# Patient Record
Sex: Male | Born: 1995 | Race: White | Hispanic: No | Marital: Married | Smoking: Never smoker
Health system: Southern US, Community
[De-identification: ages and names within clinical notes are randomized; demographics above are authoritative.]

## PROBLEM LIST (undated history)

## (undated) DIAGNOSIS — J302 Other seasonal allergic rhinitis: Secondary | ICD-10-CM

## (undated) DIAGNOSIS — F909 Attention-deficit hyperactivity disorder, unspecified type: Secondary | ICD-10-CM

## (undated) DIAGNOSIS — R040 Epistaxis: Secondary | ICD-10-CM

## (undated) DIAGNOSIS — F329 Major depressive disorder, single episode, unspecified: Secondary | ICD-10-CM

## (undated) DIAGNOSIS — F32A Depression, unspecified: Secondary | ICD-10-CM

## (undated) DIAGNOSIS — J45909 Unspecified asthma, uncomplicated: Secondary | ICD-10-CM

## (undated) HISTORY — PX: SHOULDER ARTHROSCOPY: SHX128

---
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
## 2016-08-11 ENCOUNTER — Other Ambulatory Visit: Payer: Self-pay | Admitting: Occupational Medicine

## 2016-08-11 ENCOUNTER — Ambulatory Visit: Payer: Self-pay

## 2016-08-11 DIAGNOSIS — M79671 Pain in right foot: Secondary | ICD-10-CM

## 2016-08-31 ENCOUNTER — Other Ambulatory Visit: Payer: Self-pay | Admitting: Occupational Medicine

## 2016-08-31 ENCOUNTER — Ambulatory Visit: Payer: Self-pay

## 2016-08-31 DIAGNOSIS — M79671 Pain in right foot: Secondary | ICD-10-CM

## 2017-04-13 ENCOUNTER — Other Ambulatory Visit: Payer: Self-pay | Admitting: *Deleted

## 2017-04-25 ENCOUNTER — Ambulatory Visit: Payer: Self-pay | Admitting: Family Medicine

## 2017-04-26 ENCOUNTER — Encounter: Payer: Self-pay | Admitting: Family Medicine

## 2017-12-23 ENCOUNTER — Other Ambulatory Visit: Payer: Self-pay

## 2017-12-23 ENCOUNTER — Emergency Department (HOSPITAL_COMMUNITY)
Admission: EM | Admit: 2017-12-23 | Discharge: 2017-12-24 | Disposition: A | Discharge status: Home / Self Care | Attending: Emergency Medicine | Admitting: Emergency Medicine

## 2017-12-23 ENCOUNTER — Encounter (HOSPITAL_COMMUNITY): Payer: Self-pay | Admitting: *Deleted

## 2017-12-23 DIAGNOSIS — F329 Major depressive disorder, single episode, unspecified: Secondary | ICD-10-CM | POA: Diagnosis not present

## 2017-12-23 DIAGNOSIS — R1030 Lower abdominal pain, unspecified: Secondary | ICD-10-CM | POA: Diagnosis not present

## 2017-12-23 DIAGNOSIS — F909 Attention-deficit hyperactivity disorder, unspecified type: Secondary | ICD-10-CM | POA: Insufficient documentation

## 2017-12-23 DIAGNOSIS — K6289 Other specified diseases of anus and rectum: Secondary | ICD-10-CM | POA: Diagnosis not present

## 2017-12-23 DIAGNOSIS — K625 Hemorrhage of anus and rectum: Secondary | ICD-10-CM

## 2017-12-23 DIAGNOSIS — J45909 Unspecified asthma, uncomplicated: Secondary | ICD-10-CM | POA: Insufficient documentation

## 2017-12-23 DIAGNOSIS — Z79899 Other long term (current) drug therapy: Secondary | ICD-10-CM | POA: Insufficient documentation

## 2017-12-23 NOTE — ED Triage Notes (Signed)
Pt c/o right side abd pain, rectal bleeding that has been intermittent for the past few months, worse since Monday, pt also c/o urinary retention, pt also reports that a few weeks ago he was vomiting dark emesis.

## 2017-12-24 LAB — COMPREHENSIVE METABOLIC PANEL
ALK PHOS: 44 U/L (ref 38–126)
ALT: 14 U/L (ref 0–44)
AST: 21 U/L (ref 15–41)
Albumin: 5 g/dL (ref 3.5–5.0)
Anion gap: 10 (ref 5–15)
BILIRUBIN TOTAL: 1.8 mg/dL — AB (ref 0.3–1.2)
BUN: 9 mg/dL (ref 6–20)
CALCIUM: 9.7 mg/dL (ref 8.9–10.3)
CO2: 26 mmol/L (ref 22–32)
CREATININE: 0.99 mg/dL (ref 0.61–1.24)
Chloride: 107 mmol/L (ref 98–111)
GFR calc Af Amer: >60 mL/min (ref >60)
GLUCOSE: 94 mg/dL (ref 70–99)
Potassium: 3.4 mmol/L — ABNORMAL LOW (ref 3.5–5.1)
Sodium: 143 mmol/L (ref 135–145)
TOTAL PROTEIN: 8 g/dL (ref 6.5–8.1)

## 2017-12-24 LAB — CBC WITH DIFFERENTIAL/PLATELET
Abs Immature Granulocytes: 0.01 10*3/uL (ref 0.00–0.07)
BASOS ABS: 0 10*3/uL (ref 0.0–0.1)
Basophils Relative: 0 %
Eosinophils Absolute: 0.2 10*3/uL (ref 0.0–0.5)
Eosinophils Relative: 2 %
HEMATOCRIT: 42.1 % (ref 39.0–52.0)
Hemoglobin: 14.3 g/dL (ref 13.0–17.0)
IMMATURE GRANULOCYTES: 0 %
Lymphocytes Relative: 26 %
Lymphs Abs: 2 10*3/uL (ref 0.7–4.0)
MCH: 29 pg (ref 26.0–34.0)
MCHC: 34 g/dL (ref 30.0–36.0)
MCV: 85.4 fL (ref 80.0–100.0)
Monocytes Absolute: 0.6 10*3/uL (ref 0.1–1.0)
Monocytes Relative: 8 %
Neutro Abs: 4.9 10*3/uL (ref 1.7–7.7)
Neutrophils Relative %: 64 %
Platelets: 307 10*3/uL (ref 150–400)
RBC: 4.93 MIL/uL (ref 4.22–5.81)
RDW: 12.3 % (ref 11.5–15.5)
WBC: 7.6 10*3/uL (ref 4.0–10.5)
nRBC: 0 % (ref 0.0–0.2)

## 2017-12-24 LAB — POC OCCULT BLOOD, ED: Fecal Occult Bld: POSITIVE — AB

## 2017-12-24 MED ORDER — HYDROCORTISONE ACETATE 25 MG RE SUPP
25.0000 mg | Freq: Once | RECTAL | Status: AC
  Administered 2017-12-24: 25 mg via RECTAL
  Filled 2017-12-24: qty 1

## 2017-12-24 MED ORDER — HYDROCORTISONE ACETATE 25 MG RE SUPP: 25.0000 mg | Freq: Two times a day (BID) | RECTAL | 0 refills | Status: DC

## 2017-12-24 NOTE — ED Provider Notes (Signed)
[facility] EMERGENCY DEPARTMENT Provider Note   CSN: [id number] Arrival date & time: 12/23/17  2319  Time seen 23:53 PM    History   Chief Complaint Chief Complaint  Patient presents with  . Abdominal Pain    HPI Tom Collins is a 22 y.o. male.  HPI patient is here for his girlfriend and his mother.  He states he has been having rectal bleeding and rectal pain off and on for the past 7 months.  He also describes abdominal pain that comes and goes for the past 2 to 3 months.  He states he gets the abdominal pain several times a week.  It is usually on the right side and he describes it as sharp, crampy, and aching.  It will last a few hours.  He states when he eats breakfast in the morning the abdominal pain will quickly come on.  But it feels better after having a bowel movement.  He states he sees blood every time he has a bowel movement.  He sees blood when he wipes and sees blood in the toilet.  He states he has nausea and vomiting for the past 3 months and it happens a few times a month.  When he does have vomiting he will only have 1 or 2 episodes that day.  Sometimes he gets burning fluid or reflux symptoms.  Family is also concerned for the past year and a half that he gets swelling of his lips and throats when he eats raw vegetables and fruits he can eat them if they are cooked.  He states he is here tonight because he has had increasing bleeding since Monday, November 18.  He has not had any vomiting today.  PCP Richmond Campbell., PA-C   Past Medical History:  Diagnosis Date  . ADHD (attention deficit hyperactivity disorder)   . Asthma   . Depression   . Epistaxis   . Seasonal allergies     Patient Active Problem List   Diagnosis Date Noted  . Multiple allergies 12/09/2015  . Asthma 12/09/2015    Past Surgical History:  Procedure Laterality Date  . SHOULDER ARTHROSCOPY          Home Medications    Prior to Admission medications   Medication Sig Start  Date End Date Taking? Authorizing Provider  amphetamine-dextroamphetamine (ADDERALL) 30 MG tablet TAKE ONE PILL TWICE PER DAY 10/18/17  Yes [provider]  Eszopiclone 3 MG TABS Take immediately before bedtime 10/31/17  Yes [provider]  hydrocortisone (ANUSOL-HC) 25 MG suppository Place 1 suppository (25 mg total) rectally 2 (two) times daily. 12/24/17   Devoria Albe, MD  traMADol (ULTRAM) 50 MG tablet Take by mouth every 6 (six) hours as needed.    [provider]    Family History History reviewed. No pertinent family history.  Social History Social History   Tobacco Use  . Smoking status: Never Smoker  . Smokeless tobacco: Current User  Substance Use Topics  . Alcohol use: No  . Drug use: Never     Allergies   Bee venom and Other   Review of Systems Review of Systems  All other systems reviewed and are negative.    Physical Exam Updated Vital Signs BP 135/84   Pulse (!) 101   Temp 99.2 F (37.3 C) (Oral)   Resp 20   Ht 5\' 10"  (1.778 m)   Wt 86.2 kg   SpO2 100%   BMI 27.26 kg/m   Vital  signs normal borderline tachycardia   Physical Exam  Constitutional: He is oriented to person, place, and time. He appears well-developed and well-nourished.  Non-toxic appearance. He does not appear ill. No distress.  HENT:  Head: Normocephalic and atraumatic.  Right Ear: External ear normal.  Left Ear: External ear normal.  Nose: Nose normal. No mucosal edema or rhinorrhea.  Mouth/Throat: Oropharynx is clear and moist and mucous membranes are normal. No dental abscesses or uvula swelling.  Eyes: Pupils are equal, round, and reactive to light. Conjunctivae and EOM are normal.  Neck: Normal range of motion and full passive range of motion without pain. Neck supple.  Cardiovascular: Normal rate, regular rhythm and normal heart sounds. Exam reveals no gallop and no friction rub.  No murmur heard. Pulmonary/Chest: Effort normal and breath sounds  normal. No respiratory distress. He has no wheezes. He has no rhonchi. He has no rales. He exhibits no tenderness and no crepitus.  Abdominal: Soft. Normal appearance. He exhibits no distension. Bowel sounds are decreased. There is no tenderness. There is no rebound and no guarding.  Genitourinary:  Genitourinary Comments: When I examine his rectal area there is no visible hemorrhoids or abnormal swelling or redness seen.  On rectal exam he is tight, there was no obvious masses felt.  There was no stool or gross blood on my finger however there were 2 small specks of blood in the Hemoccult did test positive on 1 of the specks.  Musculoskeletal: Normal range of motion. He exhibits no edema or tenderness.  Moves all extremities well.   Neurological: He is alert and oriented to person, place, and time. He has normal strength. No cranial nerve deficit.  Skin: Skin is warm, dry and intact. No rash noted. No erythema. No pallor.  Psychiatric: He has a normal mood and affect. His speech is normal and behavior is normal. His mood appears not anxious.  Nursing note and vitals reviewed.    ED Treatments / Results  Labs (all labs ordered are listed, but only abnormal results are displayed) Results for orders placed or performed during the hospital encounter of 12/23/17  Comprehensive metabolic panel  Result Value Ref Range   Sodium 143 135 - 145 mmol/L   Potassium 3.4 (L) 3.5 - 5.1 mmol/L   Chloride 107 98 - 111 mmol/L   CO2 26 22 - 32 mmol/L   Glucose, Bld 94 70 - 99 mg/dL   BUN 9 6 - 20 mg/dL   Creatinine, Ser 9.140.99 0.61 - 1.24 mg/dL   Calcium 9.7 8.9 - 78.210.3 mg/dL   Total Protein 8.0 6.5 - 8.1 g/dL   Albumin 5.0 3.5 - 5.0 g/dL   AST 21 15 - 41 U/L   ALT 14 0 - 44 U/L   Alkaline Phosphatase 44 38 - 126 U/L   Total Bilirubin 1.8 (H) 0.3 - 1.2 mg/dL   GFR calc non Af Amer >60 >60 mL/min   GFR calc Af Amer >60 >60 mL/min   Anion gap 10 5 - 15  CBC with Differential  Result Value Ref Range    WBC 7.6 4.0 - 10.5 K/uL   RBC 4.93 4.22 - 5.81 MIL/uL   Hemoglobin 14.3 13.0 - 17.0 g/dL   HCT 95.642.1 21.339.0 - 08.652.0 %   MCV 85.4 80.0 - 100.0 fL   MCH 29.0 26.0 - 34.0 pg   MCHC 34.0 30.0 - 36.0 g/dL   RDW 57.812.3 46.911.5 - 62.915.5 %   Platelets 307 150 - 400  K/uL   nRBC 0.0 0.0 - 0.2 %   Neutrophils Relative % 64 %   Neutro Abs 4.9 1.7 - 7.7 K/uL   Lymphocytes Relative 26 %   Lymphs Abs 2.0 0.7 - 4.0 K/uL   Monocytes Relative 8 %   Monocytes Absolute 0.6 0.1 - 1.0 K/uL   Eosinophils Relative 2 %   Eosinophils Absolute 0.2 0.0 - 0.5 K/uL   Basophils Relative 0 %   Basophils Absolute 0.0 0.0 - 0.1 K/uL   Immature Granulocytes 0 %   Abs Immature Granulocytes 0.01 0.00 - 0.07 K/uL  POC occult blood, ED Provider will collect  Result Value Ref Range   Fecal Occult Bld POSITIVE (A) NEGATIVE   Laboratory interpretation all normal except mildly positive Hemoccult, mild hypokalemia    EKG None  Radiology No results found.  Procedures Procedures (including critical care time)  Medications Ordered in ED Medications  hydrocortisone (ANUSOL-HC) suppository 25 mg (has no administration in time range)     Initial Impression / Assessment and Plan / ED Course  I have reviewed the triage vital signs and the nursing notes.  Pertinent labs & imaging results that were available during my care of the patient were reviewed by me and considered in my medical decision making (see chart for details).    After doing his rectal exam I talked to the patient he may have internal hemorrhoids.  Mother is concerned that he may have polyps.  I explained to her that that would have to be determined by gastroenterologist by colonoscopy.  She looked at me as if I did know what I was talking about.  Patient's initial bladder scan showed 515 cc of urine.  His post void residual was 17 cc.  Patient does not have evidence of urinary retention.  Patient will be treated for internal hemorrhoids and referred to  gastroenterology for further evaluation of his rectal bleeding and his abdominal pain.  He had no abdominal pain today in the ED.  He is not anemic from the bleeding.  Final Clinical Impressions(s) / ED Diagnoses   Final diagnoses:  Rectal pain  Rectal bleeding  Lower abdominal pain    ED Discharge Orders         Ordered    hydrocortisone (ANUSOL-HC) 25 MG suppository  2 times daily     12/24/17 0249         Plan discharge  Devoria Albe, MD, Concha Pyo, MD 12/24/17 [phone]

## 2017-12-24 NOTE — Discharge Instructions (Addendum)
Use the suppositories which should help calm down pain from your internal hemorrhoids.  Please call Dr. Patty Sermonsehman's office to schedule a appointment to be evaluated for your rectal pain and bleeding and abdominal pain.  He is a gastroenterologist.  You can discuss your allergies to raw fruits and vegetables with your primary care doctor.

## 2018-08-06 ENCOUNTER — Ambulatory Visit
Admission: RE | Admit: 2018-08-06 | Discharge: 2018-08-06 | Disposition: A | Discharge status: Home / Self Care | Source: Ambulatory Visit | Attending: Physician Assistant | Admitting: Physician Assistant

## 2018-08-06 ENCOUNTER — Other Ambulatory Visit: Payer: Self-pay | Admitting: Physician Assistant

## 2018-08-06 ENCOUNTER — Other Ambulatory Visit: Payer: Self-pay

## 2018-08-06 DIAGNOSIS — M545 Low back pain, unspecified: Secondary | ICD-10-CM

## 2019-02-18 ENCOUNTER — Ambulatory Visit: Payer: Self-pay | Attending: Internal Medicine

## 2019-02-18 ENCOUNTER — Other Ambulatory Visit: Payer: Self-pay

## 2019-02-18 DIAGNOSIS — Z20822 Contact with and (suspected) exposure to covid-19: Secondary | ICD-10-CM | POA: Insufficient documentation

## 2019-02-19 LAB — NOVEL CORONAVIRUS, NAA: SARS-CoV-2, NAA: NOT DETECTED

## 2019-09-15 ENCOUNTER — Other Ambulatory Visit: Payer: Self-pay

## 2019-09-15 DIAGNOSIS — R202 Paresthesia of skin: Secondary | ICD-10-CM | POA: Insufficient documentation

## 2019-09-15 DIAGNOSIS — J45909 Unspecified asthma, uncomplicated: Secondary | ICD-10-CM | POA: Diagnosis not present

## 2019-09-15 DIAGNOSIS — G8929 Other chronic pain: Secondary | ICD-10-CM | POA: Insufficient documentation

## 2019-09-15 DIAGNOSIS — M545 Low back pain: Secondary | ICD-10-CM | POA: Insufficient documentation

## 2019-09-15 DIAGNOSIS — M549 Dorsalgia, unspecified: Secondary | ICD-10-CM | POA: Diagnosis present

## 2019-09-16 ENCOUNTER — Emergency Department (HOSPITAL_COMMUNITY)
Admission: EM | Admit: 2019-09-16 | Discharge: 2019-09-16 | Disposition: A | Discharge status: Home / Self Care | Payer: PRIVATE HEALTH INSURANCE | Attending: Emergency Medicine | Admitting: Emergency Medicine

## 2019-09-16 ENCOUNTER — Other Ambulatory Visit: Payer: Self-pay

## 2019-09-16 ENCOUNTER — Emergency Department (HOSPITAL_COMMUNITY): Payer: PRIVATE HEALTH INSURANCE

## 2019-09-16 ENCOUNTER — Encounter (HOSPITAL_COMMUNITY): Payer: Self-pay | Admitting: Emergency Medicine

## 2019-09-16 DIAGNOSIS — G8929 Other chronic pain: Secondary | ICD-10-CM

## 2019-09-16 LAB — URINALYSIS, ROUTINE W REFLEX MICROSCOPIC
Bilirubin Urine: NEGATIVE
Glucose, UA: NEGATIVE mg/dL
Hgb urine dipstick: NEGATIVE
Ketones, ur: NEGATIVE mg/dL
Leukocytes,Ua: NEGATIVE
Nitrite: NEGATIVE
Protein, ur: NEGATIVE mg/dL
Specific Gravity, Urine: 1.008 (ref 1.005–1.030)
pH: 7 (ref 5.0–8.0)

## 2019-09-16 MED ORDER — KETOROLAC TROMETHAMINE 30 MG/ML IJ SOLN
30.0000 mg | Freq: Once | INTRAMUSCULAR | Status: AC
  Administered 2019-09-16: 30 mg via INTRAMUSCULAR
  Filled 2019-09-16: qty 1

## 2019-09-16 MED ORDER — METHOCARBAMOL 500 MG PO TABS: 500.0000 mg | ORAL_TABLET | Freq: Three times a day (TID) | ORAL | 0 refills | Status: DC | PRN

## 2019-09-16 MED ORDER — HYDROCODONE-ACETAMINOPHEN 5-325 MG PO TABS
1.0000 | ORAL_TABLET | Freq: Once | ORAL | Status: AC
  Administered 2019-09-16: 1 via ORAL
  Filled 2019-09-16: qty 1

## 2019-09-16 MED ORDER — METHOCARBAMOL 500 MG PO TABS
500.0000 mg | ORAL_TABLET | Freq: Once | ORAL | Status: AC
  Administered 2019-09-16: 500 mg via ORAL
  Filled 2019-09-16: qty 1

## 2019-09-16 MED ORDER — METHYLPREDNISOLONE 4 MG PO TBPK: ORAL_TABLET | ORAL | 0 refills | Status: DC

## 2019-09-16 NOTE — Discharge Instructions (Addendum)
Take the steroids and muscle relaxers as prescribed.  Follow-up with the neurosurgeon for an MRI.  Return to the ED with worsening pain, fever, weakness, numbness, tingling, bowel or bladder incontinence or any other concerns.

## 2019-09-16 NOTE — ED Triage Notes (Addendum)
Pt c/o lower back pain that has been going on for a while. Pt seen by ortho about a year ago and was told he had fractures at L4 and L5.

## 2019-09-16 NOTE — ED Provider Notes (Signed)
[facility] EMERGENCY DEPARTMENT Provider Note   CSN: [id number] Arrival date & time: 09/15/19  2251     History Chief Complaint  Patient presents with  . Back Pain    Tom Collins is a 24 y.o. male.  Patient with a history of chronic low back pain for the past 2 years.  States he has pain on a daily basis but has not taken thing for it at home.  Has seen orthopedics in the past and told he had "fractures of L4 and L5" but has not followed back up with them.  He was told he might need a spinal fusion.  Today he is coming in with worsening back pain after getting out of a chair.  He states he is not had pain this bad for a long time.  The pain is in the center of his back with some radiation down his left leg.  There is some numbness and tingling in his left foot which is been ongoing for the past couple months.  There is no weakness in the leg.  There is no bowel or bladder incontinence.  There is no fever or vomiting.  No chest pain or shortness of breath.  No abdominal pain, pain with urination or blood in the urine.  No previous back surgeries.  He states the pain is in the same location but worse than usual.  He is not certain what type of fractures that he had.  He denies having a fall recently or in the past. Denies any history of cancer or IV drug abuse.   The history is provided by the patient.  Back Pain Associated symptoms: no abdominal pain, no chest pain, no dysuria, no fever, no headaches and no weakness        Past Medical History:  Diagnosis Date  . ADHD (attention deficit hyperactivity disorder)   . Asthma   . Depression   . Epistaxis   . Seasonal allergies     Patient Active Problem List   Diagnosis Date Noted  . Multiple allergies 12/09/2015  . Asthma 12/09/2015    Past Surgical History:  Procedure Laterality Date  . SHOULDER ARTHROSCOPY         History reviewed. No pertinent family history.  Social History   Tobacco Use  . Smoking status:  Never Smoker  . Smokeless tobacco: Current User  Vaping Use  . Vaping Use: Every day  Substance Use Topics  . Alcohol use: No  . Drug use: Never    Home Medications Prior to Admission medications   Medication Sig Start Date End Date Taking? Authorizing Provider  amphetamine-dextroamphetamine (ADDERALL) 30 MG tablet TAKE ONE PILL TWICE PER DAY 10/18/17   [provider]  Eszopiclone 3 MG TABS Take immediately before bedtime 10/31/17   [provider]  hydrocortisone (ANUSOL-HC) 25 MG suppository Place 1 suppository (25 mg total) rectally 2 (two) times daily. 12/24/17   Devoria Albe, MD  traMADol (ULTRAM) 50 MG tablet Take by mouth every 6 (six) hours as needed.    [provider]    Allergies    Bee venom and Other  Review of Systems   Review of Systems  Constitutional: Negative for activity change, appetite change, fatigue and fever.  HENT: Negative for congestion and rhinorrhea.   Eyes: Negative for visual disturbance.  Respiratory: Negative for cough, chest tightness and shortness of breath.   Cardiovascular: Negative for chest pain.  Gastrointestinal: Negative for abdominal pain, nausea and vomiting.  Genitourinary:  Negative for dysuria and hematuria.  Musculoskeletal: Positive for arthralgias, back pain and myalgias.  Skin: Negative for wound.  Neurological: Negative for dizziness, weakness and headaches.   all other systems are negative except as noted in the HPI and PMH.    Physical Exam Updated Vital Signs BP 122/73 (BP Location: Right Arm)   Pulse 81   Temp 98.5 F (36.9 C) (Oral)   Resp 16   Ht 5\' 9"  (1.753 m)   Wt 83.9 kg   SpO2 100%   BMI 27.32 kg/m   Physical Exam Vitals and nursing note reviewed.  Constitutional:      General: He is not in acute distress.    Appearance: He is well-developed.     Comments: Uncomfortable, pain with position change  HENT:     Head: Normocephalic and atraumatic.     Mouth/Throat:     Pharynx:  No oropharyngeal exudate.  Eyes:     Conjunctiva/sclera: Conjunctivae normal.     Pupils: Pupils are equal, round, and reactive to light.  Neck:     Comments: No meningismus. Cardiovascular:     Rate and Rhythm: Normal rate and regular rhythm.     Heart sounds: Normal heart sounds. No murmur heard.   Pulmonary:     Effort: Pulmonary effort is normal. No respiratory distress.     Breath sounds: Normal breath sounds.  Abdominal:     Palpations: Abdomen is soft.     Tenderness: There is no abdominal tenderness. There is no guarding or rebound.  Musculoskeletal:        General: Tenderness present. Normal range of motion.     Cervical back: Normal range of motion and neck supple.     Comments: Midline lumbar tenderness, no CVA tenderness  , 5/5 strength in bilateral lower extremities. Ankle plantar and dorsiflexion intact. Great toe extension intact bilaterally. +2 DP and PT pulses. +2 patellar reflexes bilaterally. Normal gait. Subjective paresthesias in left foot  Skin:    General: Skin is warm.     Capillary Refill: Capillary refill takes less than 2 seconds.  Neurological:     General: No focal deficit present.     Mental Status: He is alert and oriented to person, place, and time. Mental status is at baseline.     Cranial Nerves: No cranial nerve deficit.     Motor: No abnormal muscle tone.     Coordination: Coordination normal.     Comments: No ataxia on finger to nose bilaterally. No pronator drift. 5/5 strength throughout. CN 2-12 intact.Equal grip strength. Sensation intact.   Psychiatric:        Behavior: Behavior normal.     ED Results / Procedures / Treatments   Labs (all labs ordered are listed, but only abnormal results are displayed) Labs Reviewed  URINALYSIS, ROUTINE W REFLEX MICROSCOPIC - Abnormal; Notable for the following components:      Result Value   Color, Urine STRAW (*)    All other components within normal limits    EKG None  Radiology CT  Lumbar Spine Wo Contrast  Result Date: 09/16/2019 CLINICAL DATA:  Low back pain EXAM: CT LUMBAR SPINE WITHOUT CONTRAST TECHNIQUE: Multidetector CT imaging of the lumbar spine was performed without intravenous contrast administration. Multiplanar CT image reconstructions were also generated. COMPARISON:  Radiograph 08/06/2018 FINDINGS: Segmentation: 5 lumbar type vertebrae. Alignment: Normal. 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 No fracture. Paraspinal and other soft tissues: No paravertebral or paraspinal soft tissue abnormality.  Disc levels: At L1-L2, no significant disc space narrowing. No canal stenosis. Foramen are patent bilaterally. At L2-L3, no significant disc space narrowing. No canal stenosis. The foramen are patent bilaterally. At L3-L4, no significant disc space narrowing. No significant canal stenosis. The foramen are patent bilaterally. At L4-L5, mild broad-based disc bulge. Suspected small central focal disc protrusion with mild mass effect on thecal sac. No canal stenosis. The foramen are patent bilaterally. At L5-S1, no significant canal stenosis. The foramen are patent bilaterally. IMPRESSION: 1. 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 No acute osseous abnormality. No significant listhesis. 2. Suspected central disc protrusion at L4-L5 with mild mass effect on the anterior thecal sac. No canal stenosis. No significant bony foraminal narrowing. Electronically Signed   By: Jasmine Pang M.D.   On: 09/16/2019 03:01    Procedures Procedures (including critical care time)  Medications Ordered in ED Medications  ketorolac (TORADOL) 30 MG/ML injection 30 mg (has no administration in time range)  HYDROcodone-acetaminophen (NORCO/VICODIN) 5-325 MG per tablet 1 tablet (has no administration in time range)  methocarbamol (ROBAXIN) tablet 500 mg (has no administration in time range)    ED Course  I have reviewed the triage vital signs and the nursing  notes.  Pertinent labs & imaging results that were available during my care of the patient were reviewed by me and considered in my medical decision making (see chart for details).    MDM Rules/Calculators/A&P                         Acute on chronic low back pain.  Intact strength, reflexes, sensation and pulses on exam.  Low suspicion for cord compression or cauda equina.  No fever or vomiting.  Patient with intact strength on exam, reflexes, sensation and pulses.  Urinalysis is negative.  He states he had an MRI last year but the results were not available in the system. Not certain what kind of fracture he was told that he had.  CT today shows chronic appearing pars defect at L5.  Small disc protrusion at L4/5 without foraminal narrowing.  Patient able to ambulate.  Will give course of steroids.  Follow-up with neurosurgery. Discussed need for repeat MRI.  Return to the ED with worsening pain, weakness, numbness, tingling, fever, vomiting, bowel or bladder incontinence or any other concerns. Final Clinical Impression(s) / ED Diagnoses Final diagnoses:  Chronic midline low back pain without sciatica    Rx / DC Orders ED Discharge Orders    None       Kristyne Woodring, Jeannett Senior, MD 09/16/19 [phone]

## 2020-05-28 ENCOUNTER — Ambulatory Visit: Payer: Self-pay

## 2020-05-28 ENCOUNTER — Other Ambulatory Visit: Payer: Self-pay

## 2020-05-28 ENCOUNTER — Other Ambulatory Visit: Payer: Self-pay | Admitting: Family Medicine

## 2020-05-28 DIAGNOSIS — M79645 Pain in left finger(s): Secondary | ICD-10-CM

## 2020-06-04 ENCOUNTER — Ambulatory Visit: Payer: Self-pay

## 2020-06-04 ENCOUNTER — Other Ambulatory Visit: Payer: Self-pay

## 2020-06-04 ENCOUNTER — Other Ambulatory Visit: Payer: Self-pay | Admitting: Family Medicine

## 2020-06-04 DIAGNOSIS — M79645 Pain in left finger(s): Secondary | ICD-10-CM

## 2021-07-13 ENCOUNTER — Inpatient Hospital Stay (HOSPITAL_COMMUNITY)
Admission: EM | Admit: 2021-07-13 | Discharge: 2021-07-16 | DRG: 372 | Disposition: A | Discharge status: Home / Self Care | Payer: BLUE CROSS/BLUE SHIELD | Attending: Internal Medicine | Admitting: Internal Medicine

## 2021-07-13 ENCOUNTER — Emergency Department (HOSPITAL_COMMUNITY): Payer: BLUE CROSS/BLUE SHIELD

## 2021-07-13 ENCOUNTER — Encounter (HOSPITAL_COMMUNITY): Payer: Self-pay | Admitting: Emergency Medicine

## 2021-07-13 ENCOUNTER — Other Ambulatory Visit: Payer: Self-pay

## 2021-07-13 DIAGNOSIS — J302 Other seasonal allergic rhinitis: Secondary | ICD-10-CM | POA: Diagnosis present

## 2021-07-13 DIAGNOSIS — A020 Salmonella enteritis: Principal | ICD-10-CM | POA: Diagnosis present

## 2021-07-13 DIAGNOSIS — R7401 Elevation of levels of liver transaminase levels: Secondary | ICD-10-CM | POA: Diagnosis present

## 2021-07-13 DIAGNOSIS — Z20822 Contact with and (suspected) exposure to covid-19: Secondary | ICD-10-CM | POA: Diagnosis present

## 2021-07-13 DIAGNOSIS — Z91030 Bee allergy status: Secondary | ICD-10-CM

## 2021-07-13 DIAGNOSIS — F419 Anxiety disorder, unspecified: Secondary | ICD-10-CM | POA: Diagnosis present

## 2021-07-13 DIAGNOSIS — Z720 Tobacco use: Secondary | ICD-10-CM

## 2021-07-13 DIAGNOSIS — F909 Attention-deficit hyperactivity disorder, unspecified type: Secondary | ICD-10-CM | POA: Diagnosis present

## 2021-07-13 DIAGNOSIS — Z8379 Family history of other diseases of the digestive system: Secondary | ICD-10-CM | POA: Diagnosis not present

## 2021-07-13 DIAGNOSIS — K529 Noninfective gastroenteritis and colitis, unspecified: Secondary | ICD-10-CM | POA: Diagnosis present

## 2021-07-13 DIAGNOSIS — Z79891 Long term (current) use of opiate analgesic: Secondary | ICD-10-CM | POA: Diagnosis not present

## 2021-07-13 DIAGNOSIS — F988 Other specified behavioral and emotional disorders with onset usually occurring in childhood and adolescence: Secondary | ICD-10-CM | POA: Diagnosis present

## 2021-07-13 DIAGNOSIS — Z79899 Other long term (current) drug therapy: Secondary | ICD-10-CM | POA: Diagnosis not present

## 2021-07-13 DIAGNOSIS — R7881 Bacteremia: Secondary | ICD-10-CM | POA: Diagnosis present

## 2021-07-13 DIAGNOSIS — Z888 Allergy status to other drugs, medicaments and biological substances status: Secondary | ICD-10-CM

## 2021-07-13 DIAGNOSIS — R109 Unspecified abdominal pain: Principal | ICD-10-CM

## 2021-07-13 LAB — COMPREHENSIVE METABOLIC PANEL
ALT: 69 U/L — ABNORMAL HIGH (ref 0–44)
AST: 67 U/L — ABNORMAL HIGH (ref 15–41)
Albumin: 3.8 g/dL (ref 3.5–5.0)
Alkaline Phosphatase: 76 U/L (ref 38–126)
Anion gap: 7 (ref 5–15)
BUN: 10 mg/dL (ref 6–20)
CO2: 23 mmol/L (ref 22–32)
Calcium: 8.9 mg/dL (ref 8.9–10.3)
Chloride: 103 mmol/L (ref 98–111)
Creatinine, Ser: 1 mg/dL (ref 0.61–1.24)
GFR, Estimated: >60 mL/min (ref >60)
Glucose, Bld: 140 mg/dL — ABNORMAL HIGH (ref 70–99)
Potassium: 3.3 mmol/L — ABNORMAL LOW (ref 3.5–5.1)
Sodium: 133 mmol/L — ABNORMAL LOW (ref 135–145)
Total Bilirubin: 1.1 mg/dL (ref 0.3–1.2)
Total Protein: 7 g/dL (ref 6.5–8.1)

## 2021-07-13 LAB — URINALYSIS, ROUTINE W REFLEX MICROSCOPIC
Bacteria, UA: NONE SEEN
Bilirubin Urine: NEGATIVE
Glucose, UA: NEGATIVE mg/dL
Hgb urine dipstick: NEGATIVE
Ketones, ur: NEGATIVE mg/dL
Leukocytes,Ua: NEGATIVE
Nitrite: NEGATIVE
Protein, ur: 100 mg/dL — AB
Specific Gravity, Urine: 1.02 (ref 1.005–1.030)
pH: 7 (ref 5.0–8.0)

## 2021-07-13 LAB — CBC
HCT: 39.2 % (ref 39.0–52.0)
Hemoglobin: 13.6 g/dL (ref 13.0–17.0)
MCH: 29.8 pg (ref 26.0–34.0)
MCHC: 34.7 g/dL (ref 30.0–36.0)
MCV: 86 fL (ref 80.0–100.0)
Platelets: 198 10*3/uL (ref 150–400)
RBC: 4.56 MIL/uL (ref 4.22–5.81)
RDW: 12.9 % (ref 11.5–15.5)
WBC: 5.1 10*3/uL (ref 4.0–10.5)
nRBC: 0 % (ref 0.0–0.2)

## 2021-07-13 LAB — SEDIMENTATION RATE: Sed Rate: 29 mm/hr — ABNORMAL HIGH (ref 0–16)

## 2021-07-13 LAB — APTT: aPTT: 33 seconds (ref 24–36)

## 2021-07-13 LAB — LIPASE, BLOOD: Lipase: 29 U/L (ref 11–51)

## 2021-07-13 LAB — LACTIC ACID, PLASMA
Lactic Acid, Venous: 1.4 mmol/L (ref 0.5–1.9)
Lactic Acid, Venous: 0.8 mmol/L (ref 0.5–1.9)

## 2021-07-13 LAB — PROTIME-INR
INR: 1.2 (ref 0.8–1.2)
Prothrombin Time: 15.1 seconds (ref 11.4–15.2)

## 2021-07-13 LAB — SARS CORONAVIRUS 2 BY RT PCR: SARS Coronavirus 2 by RT PCR: NEGATIVE

## 2021-07-13 MED ORDER — IBUPROFEN 400 MG PO TABS
600.0000 mg | ORAL_TABLET | Freq: Four times a day (QID) | ORAL | Status: DC | PRN
  Administered 2021-07-13 – 2021-07-15 (×2): 600 mg via ORAL
  Filled 2021-07-13 (×2): qty 2

## 2021-07-13 MED ORDER — PIPERACILLIN-TAZOBACTAM 3.375 G IVPB 30 MIN
3.3750 g | Freq: Once | INTRAVENOUS | Status: AC
  Administered 2021-07-13: 3.375 g via INTRAVENOUS
  Filled 2021-07-13: qty 50

## 2021-07-13 MED ORDER — ACETAMINOPHEN 325 MG PO TABS
650.0000 mg | ORAL_TABLET | Freq: Four times a day (QID) | ORAL | Status: DC | PRN
  Administered 2021-07-14 (×3): 650 mg via ORAL
  Filled 2021-07-13 (×3): qty 2

## 2021-07-13 MED ORDER — IOHEXOL 300 MG/ML  SOLN
100.0000 mL | Freq: Once | INTRAMUSCULAR | Status: AC | PRN
  Administered 2021-07-13: 100 mL via INTRAVENOUS

## 2021-07-13 MED ORDER — PIPERACILLIN-TAZOBACTAM 3.375 G IVPB
3.3750 g | Freq: Three times a day (TID) | INTRAVENOUS | Status: DC
  Administered 2021-07-13 – 2021-07-14 (×3): 3.375 g via INTRAVENOUS
  Filled 2021-07-13 (×3): qty 50

## 2021-07-13 MED ORDER — AMPHETAMINE-DEXTROAMPHETAMINE 10 MG PO TABS
20.0000 mg | ORAL_TABLET | Freq: Two times a day (BID) | ORAL | Status: DC
  Administered 2021-07-14 – 2021-07-16 (×4): 20 mg via ORAL
  Filled 2021-07-13 (×4): qty 2

## 2021-07-13 MED ORDER — ONDANSETRON HCL 4 MG/2ML IJ SOLN
4.0000 mg | Freq: Once | INTRAMUSCULAR | Status: AC
  Administered 2021-07-13: 4 mg via INTRAVENOUS
  Filled 2021-07-13: qty 2

## 2021-07-13 MED ORDER — POTASSIUM CHLORIDE IN NACL 40-0.9 MEQ/L-% IV SOLN: INTRAVENOUS | Status: DC

## 2021-07-13 MED ORDER — ONDANSETRON HCL 4 MG PO TABS: 4.0000 mg | ORAL_TABLET | Freq: Four times a day (QID) | ORAL | Status: DC | PRN

## 2021-07-13 MED ORDER — FENTANYL CITRATE PF 50 MCG/ML IJ SOSY
25.0000 ug | PREFILLED_SYRINGE | Freq: Once | INTRAMUSCULAR | Status: AC
  Administered 2021-07-13: 25 ug via INTRAVENOUS
  Filled 2021-07-13: qty 1

## 2021-07-13 MED ORDER — ONDANSETRON HCL 4 MG/2ML IJ SOLN
4.0000 mg | Freq: Four times a day (QID) | INTRAMUSCULAR | Status: DC | PRN
  Administered 2021-07-13 – 2021-07-14 (×2): 4 mg via INTRAVENOUS
  Filled 2021-07-13 (×2): qty 2

## 2021-07-13 MED ORDER — LACTATED RINGERS IV BOLUS (SEPSIS)
1000.0000 mL | Freq: Once | INTRAVENOUS | Status: AC
  Administered 2021-07-13: 1000 mL via INTRAVENOUS

## 2021-07-13 MED ORDER — ACETAMINOPHEN 650 MG RE SUPP: 650.0000 mg | Freq: Four times a day (QID) | RECTAL | Status: DC | PRN

## 2021-07-13 NOTE — Progress Notes (Signed)
Given CT findings, suggest GI consultation for possible inflammatory bowel disease.  Acute appendicitis less likely.

## 2021-07-13 NOTE — ED Triage Notes (Signed)
Pt has been experiencing abdominal pain with nausea and diarrhea, running fevers, since Sunday.  Last dose of Tylenol at 11 am today.  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

## 2021-07-13 NOTE — Progress Notes (Signed)
Pharmacy Antibiotic Note  Tom Collins is a 26 y.o. male admitted on 07/13/2021 with enterocolitis vs IBD vs appendiceal process.  Pharmacy has been consulted for piperacillin/tazobactam dosing.  Patient received piperacillin/tazobactam 3.375g x1 over 30 min. Good renal function.  Plan: Piperacillin/tazobactam 3.375g q8 hr EI  Monitor cultures, clinical status, renal function Narrow abx as able and f/u duration    Height: 5\' 10"  (177.8 cm) Weight: 90.7 kg (200 lb) IBW/kg (Calculated) : 73  Temp (24hrs), Avg:99.8 F (37.7 C), Min:99.3 F (37.4 C), Max:100.1 F (37.8 C)  Recent Labs  Lab 07/13/21 1217 07/13/21 1225 07/13/21 1418  WBC 5.1  --   --   CREATININE 1.00  --   --   LATICACIDVEN  --  1.4 0.8    Estimated Creatinine Clearance: 129.1 mL/min (by C-G formula based on SCr of 1 mg/dL).    Allergies  Allergen Reactions   Bee Venom    Other     Raw vegetables trigger swelling,     Antimicrobials this admission: Piptazo 6/13 >>    Microbiology results: 6/13 BCx: pending 6/13 UCx: pending   Thank you for allowing pharmacy to be a part of this patient's care.  7/13, PharmD, BCPS, BCCP Clinical Pharmacist  Please check AMION for all [facility] Pharmacy phone numbers After 10:00 PM, call Main Pharmacy [phone]

## 2021-07-13 NOTE — H&P (Signed)
History and Physical    Tom Collins OXW:[id number] DOB: 08/15/1995 DOA: 07/13/2021  PCP: Aletha Halim., PA-C   Patient coming from: Home  I have personally briefly reviewed patient's old medical records in [place]  Chief Complaint: Abdominal pain  HPI: Tom Collins is a 26 y.o. male with medical history significant for asthma and depression/anxiety. Patient presented to the ED with complaints of about 1 week of abdominal pain, he also reports diarrhea and fevers up to 102 at home.  He has not had any bowel movements in the past 2 to 3 days.  Reports nausea but no vomiting.  Denies prior abdominal pain or similar symptoms.  Patient's mother has Crohn's disease.  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  He reports pain with urination and right flank pain that started today.  ED Course: Tmax 100.1.  Heart rate initially 124 now down to 90s, respiratory rate 10-20.  Blood pressure systolic 1 teens to 119J.  WBC 5.1.  Potassium 3.3.  Lactic acid 1.4 > 2.8.  UA unremarkable.  Chest x-ray clear.  Abdominal CT suggest enterocolitis, infectious cause, possibility of inflammatory bowel disease.  Equivocal findings for acute appendiceal process. EDP talked to general surgeon Dr. Arnoldo Morale, who recommended IV Zosyn, n.p.o. midnight, will see in the morning.  Review of Systems: As per HPI all other systems reviewed and negative.  Past Medical History:  Diagnosis Date   ADHD (attention deficit hyperactivity disorder)    Asthma    Depression    Epistaxis    Seasonal allergies     Past Surgical History:  Procedure Laterality Date   SHOULDER ARTHROSCOPY       reports that he has never smoked. He uses smokeless tobacco. He reports that he does not drink alcohol and does not use drugs.  Allergies  Allergen Reactions   Bee Venom    Other     Raw vegetables trigger swelling,     History reviewed. No  pertinent family history.  Prior to Admission medications   Medication Sig Start Date End Date Taking? Authorizing Provider  amphetamine-dextroamphetamine (ADDERALL) 30 MG tablet TAKE ONE PILL TWICE PER DAY 10/18/17   [provider]  Eszopiclone 3 MG TABS Take immediately before bedtime 10/31/17   [provider]  hydrocortisone (ANUSOL-HC) 25 MG suppository Place 1 suppository (25 mg total) rectally 2 (two) times daily. 12/24/17   Rolland Porter, MD  methocarbamol (ROBAXIN) 500 MG tablet Take 1 tablet (500 mg total) by mouth every 8 (eight) hours as needed for muscle spasms. 09/16/19   Rancour, Annie Main, MD  methylPREDNISolone (MEDROL DOSEPAK) 4 MG TBPK tablet As directed 09/16/19   Rancour, Annie Main, MD  traMADol (ULTRAM) 50 MG tablet Take by mouth every 6 (six) hours as needed.    [provider]    Physical Exam: Vitals:   07/13/21 1143 07/13/21 1200 07/13/21 1230 07/13/21 1600  BP: 122/87 124/84 122/76 120/87  Pulse: (!) 124 (!) 110 100 90  Resp: $Remo'19 20 16 18  'UiWys$ Temp: 100.1 F (37.8 C)     TempSrc: Oral     SpO2: 95% 95% 95% 99%  Weight:      Height:        Constitutional: NAD, calm, comfortable Vitals:   07/13/21 1143 07/13/21 1200 07/13/21 1230 07/13/21 1600  BP: 122/87 124/84 122/76 120/87  Pulse: (!) 124 (!) 110 100 90  Resp: 19 20 16  18  Temp: 100.1 F (37.8 C)     TempSrc: Oral     SpO2: 95% 95% 95% 99%  Weight:      Height:       Eyes: PERRL, lids and conjunctivae normal ENMT: Mucous membranes are moist.   Neck: normal, supple, no masses, no thyromegaly Respiratory: clear to auscultation bilaterally, no wheezing, no crackles.  Cardiovascular: Regular rate and rhythm, no murmurs / rubs / gallops. No extremity edema.  Abdomen: Mild right lower quadrant tenderness, mild right flank tenderness, to palpation, no masses palpated. No hepatosplenomegaly. Bowel sounds positive.  Musculoskeletal: no clubbing / cyanosis. No joint deformity upper and lower  extremities. Good ROM, no contractures. Normal muscle tone.  Skin: no rashes, lesions, ulcers. No induration Neurologic: No apparent cranial nerve abnormality, moving extremities spontaneously.  Psychiatric: Normal judgment and insight. Alert and oriented x 3. Normal mood.   Labs on Admission: I have personally reviewed following labs and imaging studies  CBC: Recent Labs  Lab 07/13/21 1217  WBC 5.1  HGB 13.6  HCT 39.2  MCV 86.0  PLT 048   Basic Metabolic Panel: Recent Labs  Lab 07/13/21 1217  NA 133*  K 3.3*  CL 103  CO2 23  GLUCOSE 140*  BUN 10  CREATININE 1.00  CALCIUM 8.9   GFR: Estimated Creatinine Clearance: 129.1 mL/min (by C-G formula based on SCr of 1 mg/dL). Liver Function Tests: Recent Labs  Lab 07/13/21 1217  AST 67*  ALT 69*  ALKPHOS 76  BILITOT 1.1  PROT 7.0  ALBUMIN 3.8   Recent Labs  Lab 07/13/21 1217  LIPASE 29   No results for input(s): "AMMONIA" in the last 168 hours. Coagulation Profile: Recent Labs  Lab 07/13/21 1225  INR 1.2   Urine analysis:    Component Value Date/Time   COLORURINE YELLOW 07/13/2021 1148   APPEARANCEUR CLEAR 07/13/2021 1148   LABSPEC 1.020 07/13/2021 1148   PHURINE 7.0 07/13/2021 1148   GLUCOSEU NEGATIVE 07/13/2021 1148   HGBUR NEGATIVE 07/13/2021 1148   [place] 07/13/2021 1148   [place] 07/13/2021 1148   PROTEINUR 100 (A) 07/13/2021 1148   NITRITE NEGATIVE 07/13/2021 1148   LEUKOCYTESUR NEGATIVE 07/13/2021 1148    Radiological Exams on Admission: CT ABDOMEN PELVIS W CONTRAST  Result Date: 07/13/2021 CLINICAL DATA:  A 26 year old male presents with history of nausea vomiting and abdominal pain with diarrhea and fevers. EXAM: CT ABDOMEN AND PELVIS WITH CONTRAST TECHNIQUE: Multidetector CT imaging of the abdomen and pelvis was performed using the standard protocol following bolus administration of intravenous contrast. RADIATION DOSE REDUCTION: This exam was performed according to  the departmental dose-optimization program which includes automated exposure control, adjustment of the mA and/or kV according to patient size and/or use of iterative reconstruction technique. CONTRAST:  151mL OMNIPAQUE IOHEXOL 300 MG/ML  SOLN COMPARISON:  CT of the spine from August of 2021. FINDINGS: Lower chest: Lung bases are clear. No effusion or consolidation. Visualized heart is normal size. No pericardial effusion. Hepatobiliary: No focal, suspicious hepatic lesion. No pericholecystic stranding. No biliary duct dilation. Portal vein is patent. Pancreas: Normal, without mass, inflammation or ductal dilatation. Spleen: Normal. Adrenals/Urinary Tract: Adrenal glands are normal. Symmetric renal enhancement without hydronephrosis. No perivesical stranding. No bladder wall thickening. Stomach/Bowel: Stomach is normal by CT. Mild mural stratification of the terminal ileum is suggested best seen on coronal images Appendix top normal caliber without signs of periappendiceal stranding but with mild local nodal enlargement. Mild mural stratification of the  ascending colon and to a lesser extent proximal transverse colon is also evident on today's exam. The remainder of the colon is unremarkable. No signs of pneumatosis. No pneumoperitoneum. Vascular/Lymphatic: Aorta with smooth contours. IVC with smooth contours. No aneurysmal dilation of the abdominal aorta. There is no gastrohepatic or hepatoduodenal ligament lymphadenopathy. No retroperitoneal adenopathy. RIGHT sided abdominal nodal enlargement, largest 14 mm in the ileocolic mesentery. No pelvic sidewall lymphadenopathy. . Prominence of lymph nodes in the RIGHT hemiabdomen the ileocolic and pericolonic regions are less than a cm short axis. Reproductive: Unremarkable by CT. Other: No ascites. Musculoskeletal: No acute or significant osseous findings. IMPRESSION: 1. Signs of mural stratification of the colon, mainly the ascending colon. Query mild distal small bowel  thickening as well. Findings may represent enterocolitis. Correlation with infectious causes is suggested. Given potential mild mural stratification of the ileocecal valve would also consider the possibility of inflammatory bowel disease. 2. Appendiceal thickening without periappendiceal stranding is favored to be secondary and would be equivocal for acute appendiceal process in the appropriate clinical setting. 3. Reactive lymph nodes in the RIGHT hemiabdomen. Given largest is 14 mm would consider 3 to six-month follow-up to ensure resolution and or improvement Electronically Signed   By: Zetta Bills M.D.   On: 07/13/2021 15:11   DG Chest Port 1 View  Result Date: 07/13/2021 CLINICAL DATA:  Questionable sepsis - evaluate for abnormality EXAM: PORTABLE CHEST 1 VIEW COMPARISON:  Chest radiograph May 15, 2012. FINDINGS: No consolidation. No visible pleural effusions or pneumothorax. Cardiomediastinal silhouette is within normal limits. No displaced fracture. IMPRESSION: No evidence of acute cardiopulmonary disease. Electronically Signed   By: Margaretha Sheffield M.D.   On: 07/13/2021 12:41    EKG: None.  Assessment/Plan Principal Problem:   Colitis Active Problems:   ADD (attention deficit disorder)   Anxiety    Assessment and Plan: * Colitis Abdominal pain fevers and diarrhea.  Not quite meeting sepsis criteria.  Tmax of 100.1.  Heart rate 90s to 124.  Lactic acid normal 1.4 > 0.8.  Reports urinary symptoms, but UA unremarkable.  CT suggests enterocolitis, infectious versus inflammatory, findings equivocal for acute appendiceal process. -Family history of Crohn's and patient's mother -Bowel rest with clear liquid diet - EDP consulted Dr. Arnoldo Morale, IV Zosyn, n.p.o. midnight, will see in a.m. - 1 l bolus given, continue N/s + 40kcl 100cc/hr x 1 day -Repeat imaging in 3 to 6 months recommended to ensure resolution of right hemiabdominal reactive lymph nodes. -Ibuprofen 600 mg every 6 hourly as  needed -ESR, CRP -May need GI eval  Anxiety He takes temazepam as needed nightly for sleep-held for now -Resume sertraline  ADD (attention deficit disorder) Resume Adderall   DVT prophylaxis: SCDS pending general surgery evaluation in a.m. Code Status: full Family Communication: Spouse at bedside Disposition Plan: ~ /> 2 days Consults called:  Gen Surg Admission status: inpt Med surg I certify that at the point of admission it is my clinical judgment that the patient will require inpatient hospital care spanning beyond 2 midnights from the point of admission due to high intensity of service, high risk for further deterioration and high frequency of surveillance required.   Author: Bethena Roys, MD 07/13/2021 7:12 PM  For on call review [email or web address].

## 2021-07-13 NOTE — Assessment & Plan Note (Signed)
He takes temazepam as needed nightly for sleep-held for now -Resume sertraline

## 2021-07-13 NOTE — Assessment & Plan Note (Signed)
Resume Adderall 

## 2021-07-13 NOTE — ED Provider Notes (Signed)
[facility] EMERGENCY DEPARTMENT Provider Note   CSN: [id number] Arrival date & time: 07/13/21  1137     History  Chief Complaint  Patient presents with   Abdominal Pain    Tom Collins is a 26 y.o. male.   Abdominal Pain Associated symptoms: chills, diarrhea, dysuria, fever and nausea   Associated symptoms: no chest pain, no shortness of breath, no sore throat and no vomiting         Tom Collins is a 27 y.o. male who presents to the Emergency Department for evaluation of abdominal pain, nausea, diarrhea and fever.  Symptoms began 4 days ago.  Endorses intermittent fevers with max temp of 102.1 at home.  Alternating Tylenol and ibuprofen with minimal relief.  Symptoms have also been associated with generalized body aches, chills, sweats, and diffuse headache.  No neck pain or stiffness.  Last dose of Tylenol at 11 AM today.  Describes having right flank pain and dysuria symptoms beginning today.  He has had abdominal pain for several days.  He works at [employer] and was exposed to [employer].  He is concerned he has contracted a bacterial infection from the dog food.  He denies any chest pain, shortness of breath, black or bloody stools.  No recent antibiotic use.  No history of prior abdominal surgeries.   Home Medications Prior to Admission medications   Medication Sig Start Date End Date Taking? Authorizing Provider  amphetamine-dextroamphetamine (ADDERALL) 30 MG tablet TAKE ONE PILL TWICE PER DAY 10/18/17   [provider]  Eszopiclone 3 MG TABS Take immediately before bedtime 10/31/17   [provider]  hydrocortisone (ANUSOL-HC) 25 MG suppository Place 1 suppository (25 mg total) rectally 2 (two) times daily. 12/24/17   Devoria Albe, MD  methocarbamol (ROBAXIN) 500 MG tablet Take 1 tablet (500 mg total) by mouth every 8 (eight) hours as needed for muscle spasms. 09/16/19   Rancour, Jeannett Senior, MD  methylPREDNISolone (MEDROL DOSEPAK) 4 MG TBPK tablet As  directed 09/16/19   Rancour, Jeannett Senior, MD  traMADol (ULTRAM) 50 MG tablet Take by mouth every 6 (six) hours as needed.    [provider]      Allergies    Bee venom and Other    Review of Systems   Review of Systems  Constitutional:  Positive for chills, diaphoresis and fever.  HENT:  Negative for congestion, sore throat and trouble swallowing.   Eyes:  Negative for visual disturbance.  Respiratory:  Negative for shortness of breath.   Cardiovascular:  Negative for chest pain.  Gastrointestinal:  Positive for abdominal pain, diarrhea and nausea. Negative for blood in stool and vomiting.  Genitourinary:  Positive for dysuria and flank pain.  Musculoskeletal:  Positive for myalgias. Negative for neck pain and neck stiffness.  Skin:  Negative for rash.  Neurological:  Positive for dizziness and headaches. Negative for seizures, syncope and weakness.  Psychiatric/Behavioral:  Negative for confusion.     Physical Exam Updated Vital Signs BP 122/87 (BP Location: Right Arm)   Pulse (!) 124   Temp 100.1 F (37.8 C) (Oral)   Resp 19   Ht 5\' 10"  (1.778 m)   Wt 90.7 kg   SpO2 95%   BMI 28.70 kg/m  Physical Exam Vitals and nursing note reviewed.  Constitutional:      Appearance: He is ill-appearing.  HENT:     Mouth/Throat:     Mouth: Mucous membranes are moist.     Pharynx: No pharyngeal  swelling or oropharyngeal exudate.     Comments: No erythema or edema of the oropharynx.  Uvula midline nonedematous. Cardiovascular:     Rate and Rhythm: Regular rhythm. Tachycardia present.  Pulmonary:     Effort: Pulmonary effort is normal. No respiratory distress.     Breath sounds: Normal breath sounds. No wheezing.  Chest:     Chest wall: No tenderness.  Abdominal:     General: There is no distension.     Palpations: Abdomen is soft.     Tenderness: There is abdominal tenderness. There is right CVA tenderness. There is no left CVA tenderness.  Musculoskeletal:        General:  Normal range of motion.     Cervical back: Normal range of motion. No rigidity.     Right lower leg: No edema.     Left lower leg: No edema.  Skin:    General: Skin is warm.     Capillary Refill: Capillary refill takes less than 2 seconds.     Findings: No rash.  Neurological:     General: No focal deficit present.     Mental Status: He is alert.     Sensory: No sensory deficit.     Motor: No weakness.     ED Results / Procedures / Treatments   Labs (all labs ordered are listed, but only abnormal results are displayed) Labs Reviewed  COMPREHENSIVE METABOLIC PANEL - Abnormal; Notable for the following components:      Result Value   Sodium 133 (*)    Potassium 3.3 (*)    Glucose, Bld 140 (*)    AST 67 (*)    ALT 69 (*)    All other components within normal limits  URINALYSIS, ROUTINE W REFLEX MICROSCOPIC - Abnormal; Notable for the following components:   Protein, ur 100 (*)    All other components within normal limits  SARS CORONAVIRUS 2 BY RT PCR  CULTURE, BLOOD (ROUTINE X 2)  CULTURE, BLOOD (ROUTINE X 2)  URINE CULTURE  C DIFFICILE QUICK SCREEN W PCR REFLEX    GASTROINTESTINAL PANEL BY PCR, STOOL (REPLACES STOOL CULTURE)  LIPASE, BLOOD  CBC  LACTIC ACID, PLASMA  LACTIC ACID, PLASMA  PROTIME-INR  APTT    EKG None  Radiology CT ABDOMEN PELVIS W CONTRAST  Result Date: 07/13/2021 CLINICAL DATA:  A 26 year old male presents with history of nausea vomiting and abdominal pain with diarrhea and fevers. EXAM: CT ABDOMEN AND PELVIS WITH CONTRAST TECHNIQUE: Multidetector CT imaging of the abdomen and pelvis was performed using the standard protocol following bolus administration of intravenous contrast. RADIATION DOSE REDUCTION: This exam was performed according to the departmental dose-optimization program which includes automated exposure control, adjustment of the mA and/or kV according to patient size and/or use of iterative reconstruction technique. CONTRAST:  183mL  OMNIPAQUE IOHEXOL 300 MG/ML  SOLN COMPARISON:  CT of the spine from August of 2021. FINDINGS: Lower chest: Lung bases are clear. No effusion or consolidation. Visualized heart is normal size. No pericardial effusion. Hepatobiliary: No focal, suspicious hepatic lesion. No pericholecystic stranding. No biliary duct dilation. Portal vein is patent. Pancreas: Normal, without mass, inflammation or ductal dilatation. Spleen: Normal. Adrenals/Urinary Tract: Adrenal glands are normal. Symmetric renal enhancement without hydronephrosis. No perivesical stranding. No bladder wall thickening. Stomach/Bowel: Stomach is normal by CT. Mild mural stratification of the terminal ileum is suggested best seen on coronal images Appendix top normal caliber without signs of periappendiceal stranding but with mild local nodal enlargement.  Mild mural stratification of the ascending colon and to a lesser extent proximal transverse colon is also evident on today's exam. The remainder of the colon is unremarkable. No signs of pneumatosis. No pneumoperitoneum. Vascular/Lymphatic: Aorta with smooth contours. IVC with smooth contours. No aneurysmal dilation of the abdominal aorta. There is no gastrohepatic or hepatoduodenal ligament lymphadenopathy. No retroperitoneal adenopathy. RIGHT sided abdominal nodal enlargement, largest 14 mm in the ileocolic mesentery. No pelvic sidewall lymphadenopathy. . Prominence of lymph nodes in the RIGHT hemiabdomen the ileocolic and pericolonic regions are less than a cm short axis. Reproductive: Unremarkable by CT. Other: No ascites. Musculoskeletal: No acute or significant osseous findings. IMPRESSION: 1. Signs of mural stratification of the colon, mainly the ascending colon. Query mild distal small bowel thickening as well. Findings may represent enterocolitis. Correlation with infectious causes is suggested. Given potential mild mural stratification of the ileocecal valve would also consider the possibility  of inflammatory bowel disease. 2. Appendiceal thickening without periappendiceal stranding is favored to be secondary and would be equivocal for acute appendiceal process in the appropriate clinical setting. 3. Reactive lymph nodes in the RIGHT hemiabdomen. Given largest is 14 mm would consider 3 to six-month follow-up to ensure resolution and or improvement Electronically Signed   By: Zetta Bills M.D.   On: 07/13/2021 15:11   DG Chest Port 1 View  Result Date: 07/13/2021 CLINICAL DATA:  Questionable sepsis - evaluate for abnormality EXAM: PORTABLE CHEST 1 VIEW COMPARISON:  Chest radiograph May 15, 2012. FINDINGS: No consolidation. No visible pleural effusions or pneumothorax. Cardiomediastinal silhouette is within normal limits. No displaced fracture. IMPRESSION: No evidence of acute cardiopulmonary disease. Electronically Signed   By: Margaretha Sheffield M.D.   On: 07/13/2021 12:41    Procedures Procedures    Medications Ordered in ED Medications  lactated ringers bolus 1,000 mL (has no administration in time range)    ED Course/ Medical Decision Making/ A&P                           Medical Decision Making Patient here with complaint of body aches, fever, chills, abdominal pain, nausea and diarrhea.  Symptoms have been present for 4 days.  He has had a max temp of 102.1 at home.  Endorses exposure to raw dog food at his job.  No known recent sick contacts or COVID exposures.  No history of prior abdominal surgeries.  On exam, patient is diaphoretic, tachycardic and ill-appearing.  Patient's symptoms carry high risk for complications and morbidity.  I suspect developing sepsis, source unclear at this time.  Possibly urinary but also may be GI.    Amount and/or Complexity of Data Reviewed Labs: ordered.    Details: Labs interpreted by me, no evidence of leukocytosis.  Hemoglobin reassuring.  Chemistries show mild hypokalemia and slightly elevated AST and ALT.  Lactic acid unremarkable,  lipase also unremarkable.  Urinalysis without evidence of infection. Radiology: ordered.    Details: Chest x-ray without acute cardiopulmonary process, CT abdomen and pelvis shows mural stratification of the colon distal small bowel wall thickening likely related to to enterocolitis.  There is also appendiceal thickening without periappendiceal stranding, equivocal for acute appendiceal process. Discussion of management or test interpretation with external provider(s): Patient here with fever, nausea and diarrhea.  Symptoms present for 4 days.  He has some right flank tenderness on exam without specific right lower quadrant tenderness.  Labs overall reassuring.  On recheck patient feeling better after  IV fluids  Symptoms felt to be related most likely to colitis but given CT concern for appendiceal process I will consult general surgery for further recommendation.  Consulted general surgery, Dr. Arnoldo Morale regarding CT findings.  He recommends patient be admitted to medicine, n.p.o. after midnight, IV Zosyn given and he will consult tomorrow.  Consulted [facility] hospitalist, Dr. Denton Brick who agrees to admit           Final Clinical Impression(s) / ED Diagnoses Final diagnoses:  Flank pain  Colitis    Rx / DC Orders ED Discharge Orders     None         Kem Parkinson, PA-C 07/13/21 1732    Elnora Morrison, MD 07/14/21 1552

## 2021-07-13 NOTE — Assessment & Plan Note (Addendum)
Abdominal pain fevers and diarrhea.  Not quite meeting sepsis criteria.  Tmax of 100.1.  Heart rate 90s to 124.  Lactic acid normal 1.4 > 0.8.  Reports urinary symptoms, but UA unremarkable.  CT suggests enterocolitis, infectious versus inflammatory, findings equivocal for acute appendiceal process. -Family history of Crohn's and patient's mother -Bowel rest with clear liquid diet - EDP consulted Dr. Arnoldo Morale, IV Zosyn, n.p.o. midnight, will see in a.m. - 1 l bolus given, continue N/s + 40kcl 100cc/hr x 1 day -Repeat imaging in 3 to 6 months recommended to ensure resolution of right hemiabdominal reactive lymph nodes. -Ibuprofen 600 mg every 6 hourly as needed -ESR, CRP -May need GI eval

## 2021-07-14 DIAGNOSIS — K529 Noninfective gastroenteritis and colitis, unspecified: Secondary | ICD-10-CM | POA: Diagnosis not present

## 2021-07-14 LAB — BASIC METABOLIC PANEL
Anion gap: 7 (ref 5–15)
BUN: 8 mg/dL (ref 6–20)
CO2: 24 mmol/L (ref 22–32)
Calcium: 8.2 mg/dL — ABNORMAL LOW (ref 8.9–10.3)
Chloride: 109 mmol/L (ref 98–111)
Creatinine, Ser: 1 mg/dL (ref 0.61–1.24)
GFR, Estimated: >60 mL/min (ref >60)
Glucose, Bld: 91 mg/dL (ref 70–99)
Potassium: 4 mmol/L (ref 3.5–5.1)
Sodium: 140 mmol/L (ref 135–145)

## 2021-07-14 LAB — URINE CULTURE: Culture: NO GROWTH

## 2021-07-14 LAB — BLOOD CULTURE ID PANEL (REFLEXED) - BCID2
A.calcoaceticus-baumannii: NOT DETECTED
Bacteroides fragilis: NOT DETECTED
CTX-M ESBL: NOT DETECTED
Candida albicans: NOT DETECTED
Candida auris: NOT DETECTED
Candida glabrata: NOT DETECTED
Candida krusei: NOT DETECTED
Candida parapsilosis: NOT DETECTED
Candida tropicalis: NOT DETECTED
Carbapenem resist OXA 48 LIKE: NOT DETECTED
Carbapenem resistance IMP: NOT DETECTED
Carbapenem resistance KPC: NOT DETECTED
Carbapenem resistance NDM: NOT DETECTED
Carbapenem resistance VIM: NOT DETECTED
Cryptococcus neoformans/gattii: NOT DETECTED
Enterobacter cloacae complex: NOT DETECTED
Enterobacterales: DETECTED — AB
Enterococcus Faecium: NOT DETECTED
Enterococcus faecalis: NOT DETECTED
Escherichia coli: NOT DETECTED
Haemophilus influenzae: NOT DETECTED
Klebsiella aerogenes: NOT DETECTED
Klebsiella oxytoca: NOT DETECTED
Klebsiella pneumoniae: NOT DETECTED
Listeria monocytogenes: NOT DETECTED
Neisseria meningitidis: NOT DETECTED
Proteus species: NOT DETECTED
Pseudomonas aeruginosa: NOT DETECTED
Salmonella species: DETECTED — AB
Serratia marcescens: NOT DETECTED
Staphylococcus aureus (BCID): NOT DETECTED
Staphylococcus epidermidis: NOT DETECTED
Staphylococcus lugdunensis: NOT DETECTED
Staphylococcus species: NOT DETECTED
Stenotrophomonas maltophilia: NOT DETECTED
Streptococcus agalactiae: NOT DETECTED
Streptococcus pneumoniae: NOT DETECTED
Streptococcus pyogenes: NOT DETECTED
Streptococcus species: NOT DETECTED

## 2021-07-14 LAB — HIV ANTIBODY (ROUTINE TESTING W REFLEX): HIV Screen 4th Generation wRfx: NONREACTIVE

## 2021-07-14 LAB — CBC
HCT: 37.1 % — ABNORMAL LOW (ref 39.0–52.0)
Hemoglobin: 12.5 g/dL — ABNORMAL LOW (ref 13.0–17.0)
MCH: 29.8 pg (ref 26.0–34.0)
MCHC: 33.7 g/dL (ref 30.0–36.0)
MCV: 88.3 fL (ref 80.0–100.0)
Platelets: 160 10*3/uL (ref 150–400)
RBC: 4.2 MIL/uL — ABNORMAL LOW (ref 4.22–5.81)
RDW: 13.1 % (ref 11.5–15.5)
WBC: 4.9 10*3/uL (ref 4.0–10.5)
nRBC: 0 % (ref 0.0–0.2)

## 2021-07-14 LAB — C DIFFICILE QUICK SCREEN W PCR REFLEX
C Diff antigen: NEGATIVE
C Diff interpretation: NOT DETECTED
C Diff toxin: NEGATIVE

## 2021-07-14 LAB — C-REACTIVE PROTEIN: CRP: 17.6 mg/dL — ABNORMAL HIGH (ref <1.0)

## 2021-07-14 MED ORDER — GLYCERIN (LAXATIVE) 2 G RE SUPP
1.0000 | Freq: Once | RECTAL | Status: DC
  Filled 2021-07-14: qty 1

## 2021-07-14 MED ORDER — SODIUM CHLORIDE 0.9 % IV SOLN
2.0000 g | INTRAVENOUS | Status: DC
  Administered 2021-07-14 – 2021-07-15 (×2): 2 g via INTRAVENOUS
  Filled 2021-07-14 (×2): qty 20

## 2021-07-14 NOTE — Progress Notes (Signed)
PROGRESS NOTE    Tom Collins  HQP:[id number] DOB: 13-Apr-1995 DOA: 07/13/2021 PCP: Aletha Halim., PA-C   Brief Narrative:    Tom Collins is a 26 y.o. male with medical history significant for asthma and depression/anxiety. Patient presented to the ED with complaints of about 1 week of abdominal pain, he also reports diarrhea and fevers up to 102 at home.  Patient was admitted with diffuse colitis and suspicion for inflammatory bowel disease.  No need for operative intervention noted.  GI consulted for further recommendations.  Assessment & Plan:   Principal Problem:   Colitis Active Problems:   ADD (attention deficit disorder)   Anxiety  Assessment and Plan:   Colitis -Family history of Crohn's and patient's mother -Bowel rest with clear liquid diet -No need for operative intervention per general surgery -DC IV fluid -Repeat imaging in 3 to 6 months recommended to ensure resolution of right hemiabdominal reactive lymph nodes. -Ibuprofen 600 mg every 6 hourly as needed -ESR, CRP -Appreciate GI evaluation  Gram-negative rod bacteremia -Await ID and sensitivity -Continue Zosyn   Anxiety He takes temazepam as needed nightly for sleep-held for now -Resume sertraline   ADD (attention deficit disorder) Resume Adderall    DVT prophylaxis:SCDs Code Status: Full Family Communication: Spouse at bedside Disposition Plan:  Status is: Inpatient Remains inpatient appropriate because: Ongoing need for IV medications   Consultants:  General surgery GI  Procedures:  None  Antimicrobials:  Anti-infectives (From admission, onward)    Start     Dose/Rate Route Frequency Ordered Stop   07/13/21 2000  piperacillin-tazobactam (ZOSYN) IVPB 3.375 g        3.375 g 12.5 mL/hr over 240 Minutes Intravenous Every 8 hours 07/13/21 1913     07/13/21 1600  piperacillin-tazobactam (ZOSYN) IVPB 3.375 g        3.375 g 100 mL/hr over 30 Minutes Intravenous  Once 07/13/21 1545  07/13/21 1700       Subjective: Patient seen and evaluated today with improvements in abdominal pain noted with no nausea or vomiting.  Denies any bowel movements.  Objective: Vitals:   07/13/21 2145 07/14/21 0200 07/14/21 0533 07/14/21 1058  BP: (!) 97/56 (!) 93/56 96/69 127/86  Pulse: 96 65 81 75  Resp: '17 14 16 18  ' Temp: (!) 100.5 F (38.1 C) 97.8 F (36.6 C) 98.1 F (36.7 C) 98.8 F (37.1 C)  TempSrc: Oral Oral Oral Oral  SpO2: 95% 98% 97% 99%  Weight:      Height:        Intake/Output Summary (Last 24 hours) at 07/14/2021 1158 Last data filed at 07/14/2021 0900 Gross per 24 hour  Intake 1280.99 ml  Output --  Net 1280.99 ml   Filed Weights   07/13/21 1143  Weight: 90.7 kg    Examination:  General exam: Appears calm and comfortable  Respiratory system: Clear to auscultation. Respiratory effort normal. Cardiovascular system: S1 & S2 heard, RRR.  Gastrointestinal system: Abdomen is soft, tenderness to palpation over lower quadrants Central nervous system: Alert and awake Extremities: No edema Skin: No significant lesions noted Psychiatry: Flat affect.    Data Reviewed: I have personally reviewed following labs and imaging studies  CBC: Recent Labs  Lab 07/13/21 1217 07/14/21 0458  WBC 5.1 4.9  HGB 13.6 12.5*  HCT 39.2 37.1*  MCV 86.0 88.3  PLT 198 599   Basic Metabolic Panel: Recent Labs  Lab 07/13/21 1217 07/14/21 0458  NA 133* 140  K 3.3* 4.0  CL 103 109  CO2 23 24  GLUCOSE 140* 91  BUN 10 8  CREATININE 1.00 1.00  CALCIUM 8.9 8.2*   GFR: Estimated Creatinine Clearance: 129.1 mL/min (by C-G formula based on SCr of 1 mg/dL). Liver Function Tests: Recent Labs  Lab 07/13/21 1217  AST 67*  ALT 69*  ALKPHOS 76  BILITOT 1.1  PROT 7.0  ALBUMIN 3.8   Recent Labs  Lab 07/13/21 1217  LIPASE 29   No results for input(s): "AMMONIA" in the last 168 hours. Coagulation Profile: Recent Labs  Lab 07/13/21 1225  INR 1.2   Cardiac  Enzymes: No results for input(s): "CKTOTAL", "CKMB", "CKMBINDEX", "TROPONINI" in the last 168 hours. BNP (last 3 results) No results for input(s): "PROBNP" in the last 8760 hours. HbA1C: No results for input(s): "HGBA1C" in the last 72 hours. CBG: No results for input(s): "GLUCAP" in the last 168 hours. Lipid Profile: No results for input(s): "CHOL", "HDL", "LDLCALC", "TRIG", "CHOLHDL", "LDLDIRECT" in the last 72 hours. Thyroid Function Tests: No results for input(s): "TSH", "T4TOTAL", "FREET4", "T3FREE", "THYROIDAB" in the last 72 hours. Anemia Panel: No results for input(s): "VITAMINB12", "FOLATE", "FERRITIN", "TIBC", "IRON", "RETICCTPCT" in the last 72 hours. Sepsis Labs: Recent Labs  Lab 07/13/21 1225 07/13/21 1418  LATICACIDVEN 1.4 0.8    Recent Results (from the past 240 hour(s))  SARS Coronavirus 2 by RT PCR (hospital order, performed in [facility] hospital lab) *cepheid single result test* Anterior Nasal Swab     Status: None   Collection Time: 07/13/21 11:48 AM   Specimen: Anterior Nasal Swab  Result Value Ref Range Status   SARS Coronavirus 2 by RT PCR NEGATIVE NEGATIVE Final    Comment: (NOTE) SARS-CoV-2 target nucleic acids are NOT DETECTED.  The SARS-CoV-2 RNA is generally detectable in upper and lower respiratory specimens during the acute phase of infection. The lowest concentration of SARS-CoV-2 viral copies this assay can detect is 250 copies / mL. A negative result does not preclude SARS-CoV-2 infection and should not be used as the sole basis for treatment or other patient management decisions.  A negative result may occur with improper specimen collection / handling, submission of specimen other than nasopharyngeal swab, presence of viral mutation(s) within the areas targeted by this assay, and inadequate number of viral copies (<250 copies / mL). A negative result must be combined with clinical observations, patient history, and epidemiological  information.  Fact Sheet for Patients:   https://www.patel.info/  Fact Sheet for Healthcare Providers: https://hall.com/  This test is not yet approved or  cleared by the [place] FDA and has been authorized for detection and/or diagnosis of SARS-CoV-2 by FDA under an Emergency Use Authorization (EUA).  This EUA will remain in effect (meaning this test can be used) for the duration of the COVID-19 declaration under Section 564(b)(1) of the Act, 21 U.S.C. section 360bbb-3(b)(1), unless the authorization is terminated or revoked sooner.  Performed at [facility], [address], [place], [place] [address]   Blood Culture (routine x 2)     Status: None (Preliminary result)   Collection Time: 07/13/21 12:25 PM   Specimen: BLOOD  Result Value Ref Range Status   Specimen Description   Final    BLOOD RIGHT ANTECUBITAL Performed at [facility], [place] [address] [place], [place] [address]    Special Requests   Final    BOTTLES DRAWN AEROBIC AND ANAEROBIC Blood Culture results may not be optimal due to an excessive volume of blood  received in culture bottles Performed at [facility], [address] [place], [place] [address]    Culture  Setup Time   Final    GRAM NEGATIVE RODS ANAEROBIC BOTTLE Gram Stain Report Called to,Read Back By and Verified With: FENSKE,P'@0346'  BY MATTHEWS, B 6.14.2023 Organism ID to follow Performed at [facility], [place] [address] [place], [place] [address]    Culture GRAM NEGATIVE RODS  Final   Report Status PENDING  Incomplete  Urine Culture     Status: None   Collection Time: 07/13/21 12:25 PM   Specimen: Urine, Clean Catch  Result Value Ref Range Status   Specimen Description   Final    URINE, CLEAN CATCH Performed at [facility], [address], [place], [place] [address]    Special Requests   Final    NONE Performed at [facility], [address] [place], [place] [address]    Culture    Final    NO GROWTH Performed at [facility], [place] [address], [place], [place] [address]    Report Status 07/14/2021 FINAL  Final  Blood Culture (routine x 2)     Status: None (Preliminary result)   Collection Time: 07/13/21 12:30 PM   Specimen: BLOOD LEFT ARM  Result Value Ref Range Status   Specimen Description BLOOD LEFT ARM  Final   Special Requests   Final    BOTTLES DRAWN AEROBIC AND ANAEROBIC Blood Culture adequate volume   Culture  Setup Time   Final    GRAM NEGATIVE RODS ANAEROBIC AND AEROBIC BOTTLES Gram Stain Report Called to,Read Back By and Verified With: BASS,V'@0748'  BY MATTHEWS, B 6.14.2023 Performed at [facility], [address], [place], [place] [address]    Culture PENDING  Incomplete   Report Status PENDING  Incomplete         Radiology Studies: CT ABDOMEN PELVIS W CONTRAST  Result Date: 07/13/2021 CLINICAL DATA:  A 26 year old male presents with history of nausea vomiting and abdominal pain with diarrhea and fevers. EXAM: CT ABDOMEN AND PELVIS WITH CONTRAST TECHNIQUE: Multidetector CT imaging of the abdomen and pelvis was performed using the standard protocol following bolus administration of intravenous contrast. RADIATION DOSE REDUCTION: This exam was performed according to the departmental dose-optimization program which includes automated exposure control, adjustment of the mA and/or kV according to patient size and/or use of iterative reconstruction technique. CONTRAST:  158m OMNIPAQUE IOHEXOL 300 MG/ML  SOLN COMPARISON:  CT of the spine from August of 2021. FINDINGS: Lower chest: Lung bases are clear. No effusion or consolidation. Visualized heart is normal size. No pericardial effusion. Hepatobiliary: No focal, suspicious hepatic lesion. No pericholecystic stranding. No biliary duct dilation. Portal vein is patent. Pancreas: Normal, without mass, inflammation or ductal dilatation. Spleen: Normal. Adrenals/Urinary Tract: Adrenal glands are normal. Symmetric  renal enhancement without hydronephrosis. No perivesical stranding. No bladder wall thickening. Stomach/Bowel: Stomach is normal by CT. Mild mural stratification of the terminal ileum is suggested best seen on coronal images Appendix top normal caliber without signs of periappendiceal stranding but with mild local nodal enlargement. Mild mural stratification of the ascending colon and to a lesser extent proximal transverse colon is also evident on today's exam. The remainder of the colon is unremarkable. No signs of pneumatosis. No pneumoperitoneum. Vascular/Lymphatic: Aorta with smooth contours. IVC with smooth contours. No aneurysmal dilation of the abdominal aorta. There is no gastrohepatic or hepatoduodenal ligament lymphadenopathy. No retroperitoneal adenopathy. RIGHT sided abdominal nodal enlargement, largest 14 mm in  the ileocolic mesentery. No pelvic sidewall lymphadenopathy. . Prominence of lymph nodes in the RIGHT hemiabdomen the ileocolic and pericolonic regions are less than a cm short axis. Reproductive: Unremarkable by CT. Other: No ascites. Musculoskeletal: No acute or significant osseous findings. IMPRESSION: 1. Signs of mural stratification of the colon, mainly the ascending colon. Query mild distal small bowel thickening as well. Findings may represent enterocolitis. Correlation with infectious causes is suggested. Given potential mild mural stratification of the ileocecal valve would also consider the possibility of inflammatory bowel disease. 2. Appendiceal thickening without periappendiceal stranding is favored to be secondary and would be equivocal for acute appendiceal process in the appropriate clinical setting. 3. Reactive lymph nodes in the RIGHT hemiabdomen. Given largest is 14 mm would consider 3 to six-month follow-up to ensure resolution and or improvement Electronically Signed   By: Zetta Bills M.D.   On: 07/13/2021 15:11   DG Chest Port 1 View  Result Date: 07/13/2021 CLINICAL  DATA:  Questionable sepsis - evaluate for abnormality EXAM: PORTABLE CHEST 1 VIEW COMPARISON:  Chest radiograph May 15, 2012. FINDINGS: No consolidation. No visible pleural effusions or pneumothorax. Cardiomediastinal silhouette is within normal limits. No displaced fracture. IMPRESSION: No evidence of acute cardiopulmonary disease. Electronically Signed   By: Margaretha Sheffield M.D.   On: 07/13/2021 12:41        Scheduled Meds:  amphetamine-dextroamphetamine  20 mg Oral BID   Continuous Infusions:  0.9 % NaCl with KCl 40 mEq / L 100 mL/hr at 07/13/21 1943   piperacillin-tazobactam (ZOSYN)  IV 3.375 g (07/14/21 0350)     LOS: 1 day    Time spent: 35 minutes    Dusty Raczkowski Darleen Crocker, DO [facility]  If 7PM-7AM, please contact night-coverage [email or web address] 07/14/2021, 11:58 AM

## 2021-07-14 NOTE — Consult Note (Signed)
Reason for Consult: Abdominal pain, enlarged appendix Referring Physician: Dr. Britta Mccreedy is an 26 y.o. male.  HPI: Patient is a 26 year old white male who presented emergency room with a 4-day history of worsening right-sided abdominal pain.  He also was having fevers and diarrhea initially, though he has not had a bowel movement for the past few days.  He has never had these symptoms before.  He was also having some right flank pain and dysuria.  This morning, his abdominal pain has eased.  He has not had a bowel movement yet.  His abdominal pain has resolved.  He has no nausea.  Interestingly, his mother has Crohn's disease.  He has never seen a GI doctor.  Past Medical History:  Diagnosis Date   ADHD (attention deficit hyperactivity disorder)    Asthma    Depression    Epistaxis    Seasonal allergies     Past Surgical History:  Procedure Laterality Date   SHOULDER ARTHROSCOPY      History reviewed. No pertinent family history.  Social History:  reports that he has never smoked. He uses smokeless tobacco. He reports that he does not drink alcohol and does not use drugs.  Allergies:  Allergies  Allergen Reactions   Bee Venom    Other     Raw vegetables trigger swelling,     Medications: I have reviewed the patient's current medications. Prior to Admission:  Medications Prior to Admission  Medication Sig Dispense Refill Last Dose   acetaminophen (TYLENOL) 500 MG tablet Take 1,000 mg by mouth every 6 (six) hours as needed.   Past Week   amphetamine-dextroamphetamine (ADDERALL) 20 MG tablet Take 20 mg by mouth 2 (two) times daily.   07/08/2021   ibuprofen (ADVIL) 200 MG tablet Take 800 mg by mouth every 6 (six) hours as needed.   Past Week   Levocetirizine Dihydrochloride (XYZAL ALLERGY 24HR PO) Take 1 tablet by mouth daily.   Past Week   temazepam (RESTORIL) 22.5 MG capsule Take 22.5 mg by mouth at bedtime.   Past Week   hydrocortisone (ANUSOL-HC) 25 MG suppository  Place 1 suppository (25 mg total) rectally 2 (two) times daily. 12 suppository 0     Results for orders placed or performed during the hospital encounter of 07/13/21 (from the past 48 hour(s))  SARS Coronavirus 2 by RT PCR (hospital order, performed in [facility] hospital lab) *cepheid single result test* Anterior Nasal Swab     Status: None   Collection Time: 07/13/21 11:48 AM   Specimen: Anterior Nasal Swab  Result Value Ref Range   SARS Coronavirus 2 by RT PCR NEGATIVE NEGATIVE    Comment: (NOTE) SARS-CoV-2 target nucleic acids are NOT DETECTED.  The SARS-CoV-2 RNA is generally detectable in upper and lower respiratory specimens during the acute phase of infection. The lowest concentration of SARS-CoV-2 viral copies this assay can detect is 250 copies / mL. A negative result does not preclude SARS-CoV-2 infection and should not be used as the sole basis for treatment or other patient management decisions.  A negative result may occur with improper specimen collection / handling, submission of specimen other than nasopharyngeal swab, presence of viral mutation(s) within the areas targeted by this assay, and inadequate number of viral copies (<250 copies / mL). A negative result must be combined with clinical observations, patient history, and epidemiological information.  Fact Sheet for Patients:   RoadLapTop.co.za  Fact Sheet for Healthcare Providers: http://kim-miller.com/  This test is  not yet approved or  cleared by the [place] and has been authorized for detection and/or diagnosis of SARS-CoV-2 by FDA under an Emergency Use Authorization (EUA).  This EUA will remain in effect (meaning this test can be used) for the duration of the COVID-19 declaration under Section 564(b)(1) of the Act, 21 U.S.C. section 360bbb-3(b)(1), unless the authorization is terminated or revoked sooner.  Performed at [facility], [address], [place], [place] [address]   Urinalysis, Routine w reflex microscopic     Status: Abnormal   Collection Time: 07/13/21 11:48 AM  Result Value Ref Range   Color, Urine YELLOW YELLOW   APPearance CLEAR CLEAR   Specific Gravity, Urine 1.020 1.005 - 1.030   pH 7.0 5.0 - 8.0   Glucose, UA NEGATIVE NEGATIVE mg/dL   Hgb urine dipstick NEGATIVE NEGATIVE   Bilirubin Urine NEGATIVE NEGATIVE   Ketones, ur NEGATIVE NEGATIVE mg/dL   Protein, ur 468 (A) NEGATIVE mg/dL   Nitrite NEGATIVE NEGATIVE   Leukocytes,Ua NEGATIVE NEGATIVE   RBC / HPF 0-5 0 - 5 RBC/hpf   WBC, UA 0-5 0 - 5 WBC/hpf   Bacteria, UA NONE SEEN NONE SEEN   Mucus PRESENT     Comment: Performed at [facility], [address] [place], [place] [address]  Lipase, blood     Status: None   Collection Time: 07/13/21 12:17 PM  Result Value Ref Range   Lipase 29 11 - 51 U/L    Comment: Performed at [facility], [address], [place], [place] [address]  Comprehensive metabolic panel     Status: Abnormal   Collection Time: 07/13/21 12:17 PM  Result Value Ref Range   Sodium 133 (L) 135 - 145 mmol/L   Potassium 3.3 (L) 3.5 - 5.1 mmol/L   Chloride 103 98 - 111 mmol/L   CO2 23 22 - 32 mmol/L   Glucose, Bld 140 (H) 70 - 99 mg/dL    Comment: Glucose reference range applies only to samples taken after fasting for at least 8 hours.   BUN 10 6 - 20 mg/dL   Creatinine, Ser 0.03 0.61 - 1.24 mg/dL   Calcium 8.9 8.9 - 70.4 mg/dL   Total Protein 7.0 6.5 - 8.1 g/dL   Albumin 3.8 3.5 - 5.0 g/dL   AST 67 (H) 15 - 41 U/L   ALT 69 (H) 0 - 44 U/L   Alkaline Phosphatase 76 38 - 126 U/L   Total Bilirubin 1.1 0.3 - 1.2 mg/dL   GFR, Estimated >88 >89 mL/min    Comment: (NOTE) Calculated using the CKD-EPI Creatinine Equation (2021)    Anion gap 7 5 - 15    Comment: Performed at [facility], [address], [place], [place] [address]  CBC     Status: None   Collection Time: 07/13/21 12:17 PM  Result Value Ref Range   WBC 5.1 4.0 - 10.5 K/uL    RBC 4.56 4.22 - 5.81 MIL/uL   Hemoglobin 13.6 13.0 - 17.0 g/dL   HCT 03.8 88.2 - 80.0 %   MCV 86.0 80.0 - 100.0 fL   MCH 29.8 26.0 - 34.0 pg   MCHC 34.7 30.0 - 36.0 g/dL   RDW 34.9 17.9 - 15.0 %   Platelets 198 150 - 400 K/uL   nRBC 0.0 0.0 - 0.2 %    Comment: Performed at [facility], [address], [place], [place] [address]  Lactic acid, plasma     Status: None  Collection Time: 07/13/21 12:25 PM  Result Value Ref Range   Lactic Acid, Venous 1.4 0.5 - 1.9 mmol/L    Comment: Performed at [facility], [address] [place], [place] [address]  Protime-INR     Status: None   Collection Time: 07/13/21 12:25 PM  Result Value Ref Range   Prothrombin Time 15.1 11.4 - 15.2 seconds   INR 1.2 0.8 - 1.2    Comment: (NOTE) INR goal varies based on device and disease states. Performed at [facility], [address], [place], [place] [address]   APTT     Status: None   Collection Time: 07/13/21 12:25 PM  Result Value Ref Range   aPTT 33 24 - 36 seconds    Comment: Performed at [facility], [address], [place], [place] [address]  Blood Culture (routine x 2)     Status: None (Preliminary result)   Collection Time: 07/13/21 12:25 PM   Specimen: Right Antecubital; Blood  Result Value Ref Range   Specimen Description RIGHT ANTECUBITAL    Special Requests      BOTTLES DRAWN AEROBIC AND ANAEROBIC Blood Culture results may not be optimal due to an excessive volume of blood received in culture bottles   Culture  Setup Time      GRAM NEGATIVE RODS ANAEROBIC BOTTLE Gram Stain Report Called to,Read Back By and Verified With: [email or web address],[id number]  BY MATTHEWS, B 6.14.2023    Culture      NO GROWTH < 24 HOURS Performed at [facility], [address] [place], [place] [address]    Report Status PENDING   Blood Culture (routine x 2)     Status: None (Preliminary result)   Collection Time: 07/13/21 12:30 PM   Specimen: BLOOD LEFT ARM  Result Value Ref Range   Specimen Description BLOOD LEFT  ARM    Special Requests      BOTTLES DRAWN AEROBIC AND ANAEROBIC Blood Culture adequate volume   Culture  Setup Time      GRAM NEGATIVE RODS ANAEROBIC AND AEROBIC BOTTLES Gram Stain Report Called to,Read Back By and Verified With: BASS,[id number]  BY MATTHEWS, B 6.14.2023 Performed at [facility], [address] [place], [place] [address]    Culture PENDING    Report Status PENDING   Lactic acid, plasma     Status: None   Collection Time: 07/13/21  2:18 PM  Result Value Ref Range   Lactic Acid, Venous 0.8 0.5 - 1.9 mmol/L    Comment: Performed at [facility], [address] [place], [place] [address]  Sedimentation rate     Status: Abnormal   Collection Time: 07/13/21  7:19 PM  Result Value Ref Range   Sed Rate 29 (H) 0 - 16 mm/hr    Comment: Performed at [facility], [address] [place], [place] [address]  C-reactive protein     Status: Abnormal   Collection Time: 07/13/21  7:19 PM  Result Value Ref Range   CRP 17.6 (H) <1.0 mg/dL    Comment: Performed at [facility], [address], [place], [place] [address]  HIV Antibody (routine testing w rflx)     Status: None   Collection Time: 07/14/21  4:58 AM  Result Value Ref Range   HIV Screen 4th Generation wRfx Non Reactive Non Reactive    Comment: Performed at [facility], [address], [place], [place] [address]  Basic metabolic panel     Status: Abnormal   Collection Time: 07/14/21  4:58 AM  Result Value Ref Range   Sodium 140 135 - 145 mmol/L    Comment: DELTA CHECK NOTED   Potassium 4.0 3.5 - 5.1 mmol/L    Comment: DELTA CHECK NOTED   Chloride 109 98 - 111 mmol/L   CO2 24 22 - 32 mmol/L   Glucose, Bld 91 70 - 99 mg/dL    Comment: Glucose reference range applies only to samples taken after fasting for at least 8 hours.   BUN 8 6 - 20 mg/dL   Creatinine, Ser 1.611.00 0.61 - 1.24 mg/dL   Calcium 8.2 (L) 8.9 - 10.3 mg/dL   GFR, Estimated >09>60 >60>60 mL/min    Comment: (NOTE) Calculated using the CKD-EPI Creatinine  Equation (2021)    Anion gap 7 5 - 15    Comment: Performed at [facility], [address] [place], [place] [address]  CBC     Status: Abnormal   Collection Time: 07/14/21  4:58 AM  Result Value Ref Range   WBC 4.9 4.0 - 10.5 K/uL   RBC 4.20 (L) 4.22 - 5.81 MIL/uL   Hemoglobin 12.5 (L) 13.0 - 17.0 g/dL   HCT 81.137.1 (L) 91.439.0 - 78.252.0 %   MCV 88.3 80.0 - 100.0 fL   MCH 29.8 26.0 - 34.0 pg   MCHC 33.7 30.0 - 36.0 g/dL   RDW 95.613.1 21.311.5 - 08.615.5 %   Platelets 160 150 - 400 K/uL   nRBC 0.0 0.0 - 0.2 %    Comment: Performed at [facility], [address] [place], [place] [address]    CT ABDOMEN PELVIS W CONTRAST  Result Date: 07/13/2021 CLINICAL DATA:  A 26 year old male presents with history of nausea vomiting and abdominal pain with diarrhea and fevers. EXAM: CT ABDOMEN AND PELVIS WITH CONTRAST TECHNIQUE: Multidetector CT imaging of the abdomen and pelvis was performed using the standard protocol following bolus administration of intravenous contrast. RADIATION DOSE REDUCTION: This exam was performed according to the departmental dose-optimization program which includes automated exposure control, adjustment of the mA and/or kV according to patient size and/or use of iterative reconstruction technique. CONTRAST:  100mL OMNIPAQUE IOHEXOL 300 MG/ML  SOLN COMPARISON:  CT of the spine from August of 2021. FINDINGS: Lower chest: Lung bases are clear. No effusion or consolidation. Visualized heart is normal size. No pericardial effusion. Hepatobiliary: No focal, suspicious hepatic lesion. No pericholecystic stranding. No biliary duct dilation. Portal vein is patent. Pancreas: Normal, without mass, inflammation or ductal dilatation. Spleen: Normal. Adrenals/Urinary Tract: Adrenal glands are normal. Symmetric renal enhancement without hydronephrosis. No perivesical stranding. No bladder wall thickening. Stomach/Bowel: Stomach is normal by CT. Mild mural stratification of the terminal ileum is suggested best seen  on coronal images Appendix top normal caliber without signs of periappendiceal stranding but with mild local nodal enlargement. Mild mural stratification of the ascending colon and to a lesser extent proximal transverse colon is also evident on today's exam. The remainder of the colon is unremarkable. No signs of pneumatosis. No pneumoperitoneum. Vascular/Lymphatic: Aorta with smooth contours. IVC with smooth contours. No aneurysmal dilation of the abdominal aorta. There is no gastrohepatic or hepatoduodenal ligament lymphadenopathy. No retroperitoneal adenopathy. RIGHT sided abdominal nodal enlargement, largest 14 mm in the ileocolic mesentery. No pelvic sidewall lymphadenopathy. . Prominence of lymph nodes in the RIGHT hemiabdomen the ileocolic and pericolonic regions are less than a cm short axis. Reproductive: Unremarkable by CT. Other: No ascites. Musculoskeletal: No acute or significant osseous findings. IMPRESSION: 1. Signs of mural stratification  of the colon, mainly the ascending colon. Query mild distal small bowel thickening as well. Findings may represent enterocolitis. Correlation with infectious causes is suggested. Given potential mild mural stratification of the ileocecal valve would also consider the possibility of inflammatory bowel disease. 2. Appendiceal thickening without periappendiceal stranding is favored to be secondary and would be equivocal for acute appendiceal process in the appropriate clinical setting. 3. Reactive lymph nodes in the RIGHT hemiabdomen. Given largest is 14 mm would consider 3 to six-month follow-up to ensure resolution and or improvement Electronically Signed   By: Donzetta Kohut M.D.   On: 07/13/2021 15:11   DG Chest Port 1 View  Result Date: 07/13/2021 CLINICAL DATA:  Questionable sepsis - evaluate for abnormality EXAM: PORTABLE CHEST 1 VIEW COMPARISON:  Chest radiograph May 15, 2012. FINDINGS: No consolidation. No visible pleural effusions or pneumothorax.  Cardiomediastinal silhouette is within normal limits. No displaced fracture. IMPRESSION: No evidence of acute cardiopulmonary disease. Electronically Signed   By: Feliberto Harts M.D.   On: 07/13/2021 12:41    ROS:  Pertinent items are noted in HPI.  Blood pressure 96/69, pulse 81, temperature 98.1 F (36.7 C), temperature source Oral, resp. rate 16, height  (1.778 m), weight 90.7 kg, SpO2 97 %. Physical Exam: Pleasant well-developed well-nourished white male no acute distress Head is normocephalic, atraumatic Lungs clear to auscultation with equal breath sounds bilaterally Heart examination reveals regular rate and rhythm without S3, S4, murmurs Abdomen is soft with minimal tenderness.  No rigidity is noted.  No right lower quadrant abdominal pain is present.  CT scan images personally reviewed  Assessment/Plan: Impression: Diffuse colitis with possibly thickened small bowel and mesenteric lymphadenopathy.  Appendix is slightly enlarged, but no appendicolith is seen.  Given his clinical course and CT findings, inflammatory bowel disease needs to be ruled out.  I doubt he has primary acute appendicitis.  I do suggest GI consultation for further work-up.  Will follow peripherally with you.  Did discuss with Dr. Sherryll Burger.  Franky Macho 07/14/2021, 9:31 AM

## 2021-07-14 NOTE — Consult Note (Addendum)
Referring Provider: No ref. provider found Primary Care Physician:  Aletha Halim., PA-C Primary Gastroenterologist:  not previously established  Date of Admission:  Date of Consultation: 07/14/21  Reason for Consultation:  nausea, diarrhea, abdominal pain, CT findings concerning for IBD  HPI:  Tom Collins is a 26 y.o. year old male with past medical history of ADHD, asthma, depression, who presented to the ED with c/o nausea, abdominal pain, diarrhea, and fever x4 days, T max at home 102.1. patient reportedly alternating tylenol and ibuprofen without relief. Also c/o generalized body aches, chills, diaphoresis, and headaches. Right flank pain and dysuria that began yesterday. Did report that he works at [employer] and is exposed to raw dog food.   ED Course: T max 100.1, HR 124, BP WNL, WBC 5.1, K+3.3, Lactic 1.4, 2.8, UA unremarkable, cxray unremarkable. CRP 17.6, Sed rate 29 C diff and GI pathogen Panel not yet collected.  -Abdominal CT: -Signs of mural stratification of the colon, mainly the ascending colon. Query mild distal small bowel thickening as well. Findings may represent enterocolitis. Correlation with infectious causes is suggested. Given potential mild mural stratification of the ileocecal valve would also consider the possibility of inflammatory bowel disease. -Appendiceal thickening without periappendiceal stranding is favored to be secondary and would be equivocal for acute appendiceal process in the appropriate clinical setting.  Consult: History: Per review of chart, patient has hx of rectal bleeding, was seen in [facility] ED in 2019 with c/o hematochezia, rectal pain and abdominal pain with fecal occult stool positive, thought secondary to hemorrhoids at that time, given anusol and referred to GI on outpatient basis for further evaluation, though it appears he was lost to follow up for this.   General surgery consulted and feel that symptoms are less likely secondary  to acute appendicitis.  Present:  Patient states that he began with diarrhea last week, started having abdominal pain, nausea and fevers on Friday. He took imodium on Sunday and has had no BMs since then. Was having 4-5 BMs per day mostly water. At baseline he has 1-2 solid BMs per day. He states he did not look and is not aware of rectal bleeding or black stools. States highest temp at home was 102.5. he states that he ate a [employer] pizza prior to diarrhea beginning, he continued to eat and having diarrhea then Friday he began having abdominal cramping, fevers, and body aches. He does note that he travels for his job to [place] [employer] and returned here on Friday. He denies any previous symptoms in similarity to this. States that appetite was good prior to acute illness, he denies any weight loss.   He does recall being seen in Ed in 2019 for rectal bleeding. He states that he has intermittent rectal bleeding, this does not happen with every BM but maybe a few times per week. Blood is typically on the toilet tissue, sometimes drips from his rectum. He does endorse some rectal discomfort at times of bleeding, he also notes some burning with wiping, when he uses moist wipes, discomfort is not as severe. He uses otc hemorrhoid cream which seems to calm down his rectal discomfort. He denies any diarrhea or constipation at baseline, states defecation is normally easy, and without straining.   He denies any rashes or changes in his skin, does endorse some joint pain at baseline.   He is on Adderall, sertraline and temazepam, has been on all of these for a few years, he denies any recent  antibiotics.   Family hx: mother has hx of Crohn's disease, maternal grandfather had CRC  Social Hx: a few 12 oz beers per day, vapes  NSAIDs: only occasionally   Colonoscopy:never [id number]  Past Medical History:  Diagnosis Date   ADHD (attention deficit hyperactivity disorder)    Asthma    Depression    Epistaxis     Seasonal allergies     Past Surgical History:  Procedure Laterality Date   SHOULDER ARTHROSCOPY      Prior to Admission medications   Medication Sig Start Date End Date Taking? Authorizing Provider  acetaminophen (TYLENOL) 500 MG tablet Take 1,000 mg by mouth every 6 (six) hours as needed.   Yes [provider]  amphetamine-dextroamphetamine (ADDERALL) 20 MG tablet Take 20 mg by mouth 2 (two) times daily. 06/24/21  Yes [provider]  ibuprofen (ADVIL) 200 MG tablet Take 800 mg by mouth every 6 (six) hours as needed.   Yes [provider]  Levocetirizine Dihydrochloride (XYZAL ALLERGY 24HR PO) Take 1 tablet by mouth daily.   Yes [provider]  temazepam (RESTORIL) 22.5 MG capsule Take 22.5 mg by mouth at bedtime. 06/24/21  Yes [provider]  hydrocortisone (ANUSOL-HC) 25 MG suppository Place 1 suppository (25 mg total) rectally 2 (two) times daily. 12/24/17   Rolland Porter, MD    Current Facility-Administered Medications  Medication Dose Route Frequency Provider Last Rate Last Admin   0.9 % NaCl with KCl 40 mEq / L  infusion   Intravenous Continuous Emokpae, Ejiroghene E, MD 100 mL/hr at 07/13/21 1943 New Bag at 07/13/21 1943   acetaminophen (TYLENOL) tablet 650 mg  650 mg Oral Q6H PRN Emokpae, Ejiroghene E, MD   650 mg at 07/14/21 [id number]   Or   acetaminophen (TYLENOL) suppository 650 mg  650 mg Rectal Q6H PRN Emokpae, Ejiroghene E, MD       amphetamine-dextroamphetamine (ADDERALL) tablet 20 mg  20 mg Oral BID Emokpae, Ejiroghene E, MD   20 mg at 07/14/21 [id number]   ibuprofen (ADVIL) tablet 600 mg  600 mg Oral Q6H PRN Emokpae, Ejiroghene E, MD   600 mg at 07/13/21 1940   ondansetron (ZOFRAN) tablet 4 mg  4 mg Oral Q6H PRN Emokpae, Ejiroghene E, MD       Or   ondansetron (ZOFRAN) injection 4 mg  4 mg Intravenous Q6H PRN Emokpae, Ejiroghene E, MD   4 mg at 07/13/21 1938   piperacillin-tazobactam (ZOSYN) IVPB 3.375 g  3.375 g Intravenous Q8H Benetta Spar D, RPH 12.5 mL/hr at 07/14/21 0350 3.375 g at 07/14/21 0350    Allergies as of 07/13/2021 - Review Complete 07/13/2021  Allergen Reaction Noted   Bee venom  12/23/2017   Other  12/23/2017    History reviewed. No pertinent family history.  Social History   Socioeconomic History   Marital status: Married    Spouse name: Not on file   Number of children: Not on file   Years of education: Not on file   Highest education level: Not on file  Occupational History   Not on file  Tobacco Use   Smoking status: Never   Smokeless tobacco: Current  Vaping Use   Vaping Use: Every day  Substance and Sexual Activity   Alcohol use: No   Drug use: Never   Sexual activity: Not on file  Other Topics Concern   Not on file  Social History Narrative   Not on file   Social Determinants of  Health   Financial Resource Strain: Not on file  Food Insecurity: Not on file  Transportation Needs: Not on file  Physical Activity: Not on file  Stress: Not on file  Social Connections: Not on file  Intimate Partner Violence: Not on file   Review of Systems: Gen: Denies fever, chills, loss of appetite, change in weight or weight loss CV: Denies chest pain, heart palpitations, syncope, edema  Resp: Denies shortness of breath with rest, cough, wheezing GI: +abdominal pain +nausea +diarrhea  GU : Denies urinary burning, urinary frequency, urinary incontinence.  MS: Denies joint pain,swelling, cramping Derm: Denies rash, itching, dry skin Psych: Denies depression, anxiety,confusion, or memory loss Heme: Denies bruising, bleeding, and enlarged lymph nodes.  Physical Exam: Vital signs in last 24 hours: Temp:  [97.8 F (36.6 C)-102.5 F (39.2 C)] 98.1 F (36.7 C) (06/14 0533) Pulse Rate:  [65-124] 81 (06/14 0533) Resp:  [10-20] 16 (06/14 0533) BP: (93-126)/(56-87) 96/69 (06/14 0533) SpO2:  [95 %-99 %] 97 % (06/14 0533) Weight:  [90.7 kg] 90.7 kg (06/13 1143) Last BM Date :  07/13/21 General:   Alert,  Well-developed, well-nourished, pleasant and cooperative in NAD Head:  Normocephalic and atraumatic. Eyes:  Sclera clear, no icterus.   Conjunctiva pink. Ears:  Normal auditory acuity. Nose:  No deformity, discharge,  or lesions. Mouth:  No deformity or lesions, dentition normal. Lungs:  Clear throughout to auscultation.   No wheezes, crackles, or rhonchi. No acute distress. Heart:  Regular rate and rhythm; no murmurs, clicks, rubs,  or gallops. Abdomen:  Soft, and nondistended. Diffusely tender. No masses, hepatosplenomegaly or hernias noted. Hypoactive bowel sounds, without guarding, and without rebound.   Rectal:  Deferred until time of colonoscopy.   Msk:  Symmetrical without gross deformities. Normal posture. Pulses:  Normal pulses noted. Extremities:  Without clubbing or edema. Neurologic:  Alert and  oriented x4;  grossly normal neurologically. Skin:  Intact without significant lesions or rashes. Psych:  Alert and cooperative. Normal mood and affect.  Intake/Output from previous day: 06/13 0701 - 06/14 0700 In: 1281 [P.O.:240; IV Piggyback:1041] Out: -  Intake/Output this shift: No intake/output data recorded.  Lab Results: Recent Labs    07/13/21 1217 07/14/21 0458  WBC 5.1 4.9  HGB 13.6 12.5*  HCT 39.2 37.1*  PLT 198 160   BMET Recent Labs    07/13/21 1217 07/14/21 0458  NA 133* 140  K 3.3* 4.0  CL 103 109  CO2 23 24  GLUCOSE 140* 91  BUN 10 8  CREATININE 1.00 1.00  CALCIUM 8.9 8.2*   LFT Recent Labs    07/13/21 1217  PROT 7.0  ALBUMIN 3.8  AST 67*  ALT 69*  ALKPHOS 76  BILITOT 1.1   PT/INR Recent Labs    07/13/21 1225  LABPROT 15.1  INR 1.2   Hepatitis Panel No results for input(s): "HEPBSAG", "HCVAB", "HEPAIGM", "HEPBIGM" in the last 72 hours. C-Diff No results for input(s): "CDIFFTOX" in the last 72 hours.  Studies/Results: CT ABDOMEN PELVIS W CONTRAST  Result Date: 07/13/2021 CLINICAL DATA:  A  26 year old male presents with history of nausea vomiting and abdominal pain with diarrhea and fevers. EXAM: CT ABDOMEN AND PELVIS WITH CONTRAST TECHNIQUE: Multidetector CT imaging of the abdomen and pelvis was performed using the standard protocol following bolus administration of intravenous contrast. RADIATION DOSE REDUCTION: This exam was performed according to the departmental dose-optimization program which includes automated exposure control, adjustment of the mA and/or kV according to patient size  and/or use of iterative reconstruction technique. CONTRAST:  142mL OMNIPAQUE IOHEXOL 300 MG/ML  SOLN COMPARISON:  CT of the spine from August of 2021. FINDINGS: Lower chest: Lung bases are clear. No effusion or consolidation. Visualized heart is normal size. No pericardial effusion. Hepatobiliary: No focal, suspicious hepatic lesion. No pericholecystic stranding. No biliary duct dilation. Portal vein is patent. Pancreas: Normal, without mass, inflammation or ductal dilatation. Spleen: Normal. Adrenals/Urinary Tract: Adrenal glands are normal. Symmetric renal enhancement without hydronephrosis. No perivesical stranding. No bladder wall thickening. Stomach/Bowel: Stomach is normal by CT. Mild mural stratification of the terminal ileum is suggested best seen on coronal images Appendix top normal caliber without signs of periappendiceal stranding but with mild local nodal enlargement. Mild mural stratification of the ascending colon and to a lesser extent proximal transverse colon is also evident on today's exam. The remainder of the colon is unremarkable. No signs of pneumatosis. No pneumoperitoneum. Vascular/Lymphatic: Aorta with smooth contours. IVC with smooth contours. No aneurysmal dilation of the abdominal aorta. There is no gastrohepatic or hepatoduodenal ligament lymphadenopathy. No retroperitoneal adenopathy. RIGHT sided abdominal nodal enlargement, largest 14 mm in the ileocolic mesentery. No pelvic sidewall  lymphadenopathy. . Prominence of lymph nodes in the RIGHT hemiabdomen the ileocolic and pericolonic regions are less than a cm short axis. Reproductive: Unremarkable by CT. Other: No ascites. Musculoskeletal: No acute or significant osseous findings. IMPRESSION: 1. Signs of mural stratification of the colon, mainly the ascending colon. Query mild distal small bowel thickening as well. Findings may represent enterocolitis. Correlation with infectious causes is suggested. Given potential mild mural stratification of the ileocecal valve would also consider the possibility of inflammatory bowel disease. 2. Appendiceal thickening without periappendiceal stranding is favored to be secondary and would be equivocal for acute appendiceal process in the appropriate clinical setting. 3. Reactive lymph nodes in the RIGHT hemiabdomen. Given largest is 14 mm would consider 3 to six-month follow-up to ensure resolution and or improvement Electronically Signed   By: Zetta Bills M.D.   On: 07/13/2021 15:11   DG Chest Port 1 View  Result Date: 07/13/2021 CLINICAL DATA:  Questionable sepsis - evaluate for abnormality EXAM: PORTABLE CHEST 1 VIEW COMPARISON:  Chest radiograph May 15, 2012. FINDINGS: No consolidation. No visible pleural effusions or pneumothorax. Cardiomediastinal silhouette is within normal limits. No displaced fracture. IMPRESSION: No evidence of acute cardiopulmonary disease. Electronically Signed   By: Margaretha Sheffield M.D.   On: 07/13/2021 12:41    Impression: Tom Collins is a 26 y.o. year old male with past medical history of ADHD, asthma, depression, who presented to the ED with c/o nausea, abdominal pain, diarrhea, and fever x4 days, T max at home 102.1. CT A/P w/contrast with findings concerning for possible IBD. GI consulted for further evaluation.  Abdominal pain/diarrhea: new onset abdominal pain, nausea, diarrhea and fever that began 5 days ago. Notably with no diarrhea x2-3 days. CT A/P  w/con with findings of mural stratification of the colon, mainly the ascending colon. Query mild distal small bowel thickening as well, question possible enterocolitis, Given potential mild mural stratification of the ileocecal valve would also consider the possibility of inflammatory bowel disease. Appendiceal thickening without periappendiceal stranding is favored to be secondary, General surgery saw patient and feels that acute appendicitis is less likely at current time.   WBC remains WNL at 4.9, hgb 12.5 down from 13.6 though likely secondary to hemodilution in setting of IVF. CRP 17.6 and Sed rate 29, though given non specificity of these  inflammatory markers, elevation could be secondary to acute infectious process. He continues to have some mild abdominal pain with diffuse tenderness throughout as well as some mild nausea, though feels symptoms have improved since initiation of abx. Has not had a BM since Sunday when he attempted but was unable to defecate. He is passing gas. Given acute onset of symptoms in setting of preliminary gram negative blood cultures, I suspect symptoms are secondary to infectious etiology versus chronic etiology such as IBD. Recommend continued supportive measures, antibiotics (pending definitive blood culture results). Consider outpatient colonoscopy if symptoms do not resolve with antibiotic therapy.   Plan: Continue with supportive measures Consider change in antibiotic therapy based on blood culture results  Clear liquid diet  Avoid NSAIDs, Recommend Tylenol PRN for pain Gentle IVF infusion Follow blood culture results Stool studies if diarrhea reoccurs  Consider outpatient colonoscopy if symptoms do not resolve with antibiotic therapy   LOS: 1 day    07/14/2021, 9:27 AM   [place] L. Alver Sorrow, MSN, APRN, AGNP-C Adult-Gerontology Nurse Practitioner [facility]

## 2021-07-14 NOTE — Progress Notes (Signed)
PHARMACY - PHYSICIAN COMMUNICATION CRITICAL VALUE ALERT - BLOOD CULTURE IDENTIFICATION (BCID)  Tom Collins is an 26 y.o. male who presented to [facility] on 07/13/2021   Assessment:  Salmonella bacteremia  Name of physician (or Provider) Contacted: Dr. Sherryll Burger  Current antibiotics: Zosyn  Changes to prescribed antibiotics recommended:  Recommendations accepted by provider- change to ceftriaxone 2 grams IV daily   Results for orders placed or performed during the hospital encounter of 07/13/21  Blood Culture ID Panel (Reflexed) (Collected: 07/13/2021 12:25 PM)  Result Value Ref Range   Enterococcus faecalis NOT DETECTED NOT DETECTED   Enterococcus Faecium NOT DETECTED NOT DETECTED   Listeria monocytogenes NOT DETECTED NOT DETECTED   Staphylococcus species NOT DETECTED NOT DETECTED   Staphylococcus aureus (BCID) NOT DETECTED NOT DETECTED   Staphylococcus epidermidis NOT DETECTED NOT DETECTED   Staphylococcus lugdunensis NOT DETECTED NOT DETECTED   Streptococcus species NOT DETECTED NOT DETECTED   Streptococcus agalactiae NOT DETECTED NOT DETECTED   Streptococcus pneumoniae NOT DETECTED NOT DETECTED   Streptococcus pyogenes NOT DETECTED NOT DETECTED   A.calcoaceticus-baumannii NOT DETECTED NOT DETECTED   Bacteroides fragilis NOT DETECTED NOT DETECTED   Enterobacterales DETECTED (A) NOT DETECTED   Enterobacter cloacae complex NOT DETECTED NOT DETECTED   Escherichia coli NOT DETECTED NOT DETECTED   Klebsiella aerogenes NOT DETECTED NOT DETECTED   Klebsiella oxytoca NOT DETECTED NOT DETECTED   Klebsiella pneumoniae NOT DETECTED NOT DETECTED   Proteus species NOT DETECTED NOT DETECTED   Salmonella species DETECTED (A) NOT DETECTED   Serratia marcescens NOT DETECTED NOT DETECTED   Haemophilus influenzae NOT DETECTED NOT DETECTED   Neisseria meningitidis NOT DETECTED NOT DETECTED   Pseudomonas aeruginosa NOT DETECTED NOT DETECTED   Stenotrophomonas maltophilia NOT DETECTED NOT DETECTED    Candida albicans NOT DETECTED NOT DETECTED   Candida auris NOT DETECTED NOT DETECTED   Candida glabrata NOT DETECTED NOT DETECTED   Candida krusei NOT DETECTED NOT DETECTED   Candida parapsilosis NOT DETECTED NOT DETECTED   Candida tropicalis NOT DETECTED NOT DETECTED   Cryptococcus neoformans/gattii NOT DETECTED NOT DETECTED   CTX-M ESBL NOT DETECTED NOT DETECTED   Carbapenem resistance IMP NOT DETECTED NOT DETECTED   Carbapenem resistance KPC NOT DETECTED NOT DETECTED   Carbapenem resistance NDM NOT DETECTED NOT DETECTED   Carbapenem resist OXA 48 LIKE NOT DETECTED NOT DETECTED   Carbapenem resistance VIM NOT DETECTED NOT DETECTED    Tom Collins 07/14/2021  2:54 PM

## 2021-07-14 NOTE — TOC Progression Note (Signed)
  Transition of Care [facility]) Screening Note   Patient Details  Name: Tom Collins Date of Birth: [birth year]   Transition of [facility] CM/SW Contact:    Elliot Gault, LCSW Phone Number: 07/14/2021, 10:51 AM    Transition of Care Department [facility]) has reviewed patient and no TOC needs have been identified at this time. We will continue to monitor patient advancement through interdisciplinary progression rounds. If new patient transition needs arise, please place a TOC consult.

## 2021-07-15 DIAGNOSIS — K529 Noninfective gastroenteritis and colitis, unspecified: Secondary | ICD-10-CM | POA: Diagnosis not present

## 2021-07-15 LAB — GASTROINTESTINAL PANEL BY PCR, STOOL (REPLACES STOOL CULTURE)

## 2021-07-15 LAB — CBC
HCT: 38.9 % — ABNORMAL LOW (ref 39.0–52.0)
Hemoglobin: 13.3 g/dL (ref 13.0–17.0)
MCH: 29.8 pg (ref 26.0–34.0)
MCHC: 34.2 g/dL (ref 30.0–36.0)
MCV: 87.2 fL (ref 80.0–100.0)
Platelets: 181 10*3/uL (ref 150–400)
RBC: 4.46 MIL/uL (ref 4.22–5.81)
RDW: 12.9 % (ref 11.5–15.5)
WBC: 5.9 10*3/uL (ref 4.0–10.5)
nRBC: 0 % (ref 0.0–0.2)

## 2021-07-15 LAB — COMPREHENSIVE METABOLIC PANEL
ALT: 65 U/L — ABNORMAL HIGH (ref 0–44)
AST: 54 U/L — ABNORMAL HIGH (ref 15–41)
Albumin: 3.6 g/dL (ref 3.5–5.0)
Alkaline Phosphatase: 74 U/L (ref 38–126)
Anion gap: 8 (ref 5–15)
BUN: 7 mg/dL (ref 6–20)
CO2: 24 mmol/L (ref 22–32)
Calcium: 8.5 mg/dL — ABNORMAL LOW (ref 8.9–10.3)
Chloride: 104 mmol/L (ref 98–111)
Creatinine, Ser: 0.97 mg/dL (ref 0.61–1.24)
GFR, Estimated: >60 mL/min (ref >60)
Glucose, Bld: 94 mg/dL (ref 70–99)
Potassium: 3.5 mmol/L (ref 3.5–5.1)
Sodium: 136 mmol/L (ref 135–145)
Total Bilirubin: 0.8 mg/dL (ref 0.3–1.2)
Total Protein: 6.9 g/dL (ref 6.5–8.1)

## 2021-07-15 LAB — MAGNESIUM: Magnesium: 1.9 mg/dL (ref 1.7–2.4)

## 2021-07-15 MED ORDER — TEMAZEPAM 7.5 MG PO CAPS
22.5000 mg | ORAL_CAPSULE | Freq: Every day | ORAL | Status: DC
  Administered 2021-07-15: 22.5 mg via ORAL
  Filled 2021-07-15: qty 1

## 2021-07-15 NOTE — Progress Notes (Signed)
Date and time results received: 07/15/21 2:51 PM  (use smartphrase ".now" to insert current time)  Test: gi panel Critical Value: positive for salmonella  Name of Provider Notified:  Dr. Sherryll Burger  Orders Received? Or Actions Taken?:

## 2021-07-15 NOTE — Progress Notes (Signed)
PROGRESS NOTE    Tom Collins  URK:[id number] DOB: 1995-08-12 DOA: 07/13/2021 PCP: Aletha Halim., PA-C   Brief Narrative:    Tom Collins is a 26 y.o. male with medical history significant for asthma and depression/anxiety. Patient presented to the ED with complaints of about 1 week of abdominal pain, he also reports diarrhea and fevers up to 102 at home.  Patient was admitted with diffuse colitis and suspicion for inflammatory bowel disease.  No need for operative intervention noted.  GI consulted for further recommendations with plans for likely outpatient colonoscopy.  He is noted to have Salmonella enterocolitis with associated bacteremia and continues to have some fevers overnight.  Assessment & Plan:   Principal Problem:   Colitis Active Problems:   ADD (attention deficit disorder)   Anxiety  Assessment and Plan:   Salmonella Enterocolitis with associated bacteremia -Family history of Crohn's and patient's mother -Bowel rest with clear liquid diet -No need for operative intervention per general surgery -DC IV fluid -Repeat imaging in 3 to 6 months recommended to ensure resolution of right hemiabdominal reactive lymph nodes. -Ibuprofen 600 mg every 6 hourly as needed -ESR, CRP -Appreciate GI evaluation with plans to advance to full liquid diet today; planning for outpatient colonoscopy -Continue Rocephin and await further ID and sensitivity and anticipate discharge when final results returned and afebrile for 24 hours   Anxiety He takes temazepam as needed nightly for sleep-held for now -Resume sertraline   ADD (attention deficit disorder) Resume Adderall     DVT prophylaxis:SCDs Code Status: Full Family Communication: Spouse at bedside Disposition Plan:  Status is: Inpatient Remains inpatient appropriate because: Ongoing need for IV medications     Consultants:  General surgery GI   Procedures:  None   Antimicrobials:  Anti-infectives (From  admission, onward)    Start     Dose/Rate Route Frequency Ordered Stop   07/14/21 1800  cefTRIAXone (ROCEPHIN) 2 g in sodium chloride 0.9 % 100 mL IVPB        2 g 200 mL/hr over 30 Minutes Intravenous Every 24 hours 07/14/21 1422     07/13/21 2000  piperacillin-tazobactam (ZOSYN) IVPB 3.375 g  Status:  Discontinued        3.375 g 12.5 mL/hr over 240 Minutes Intravenous Every 8 hours 07/13/21 1913 07/14/21 1422   07/13/21 1600  piperacillin-tazobactam (ZOSYN) IVPB 3.375 g        3.375 g 100 mL/hr over 30 Minutes Intravenous  Once 07/13/21 1545 07/13/21 1700       Subjective: Patient seen and evaluated today with improved abdominal pain and ability to tolerate clear liquid diet with no nausea or vomiting.  1 bowel movement noted last night, but no diarrhea.  Objective: Vitals:   07/14/21 1515 07/14/21 1747 07/14/21 2141 07/15/21 0419  BP:  114/74 116/80 108/66  Pulse:  75 74 86  Resp:  '18 16 16  ' Temp: (!) 103.1 F (39.5 C) 98.4 F (36.9 C) 99.6 F (37.6 C) 99.4 F (37.4 C)  TempSrc: Oral Oral Oral Oral  SpO2:  98% 99% 97%  Weight:      Height:        Intake/Output Summary (Last 24 hours) at 07/15/2021 1227 Last data filed at 07/14/2021 2200 Gross per 24 hour  Intake 720 ml  Output --  Net 720 ml   Filed Weights   07/13/21 1143  Weight: 90.7 kg    Examination:  General exam: Appears calm and comfortable  Respiratory system: Clear  to auscultation. Respiratory effort normal. Cardiovascular system: S1 & S2 heard, RRR.  Gastrointestinal system: Abdomen is soft Central nervous system: Alert and awake Extremities: No edema Skin: No significant lesions noted Psychiatry: Flat affect.    Data Reviewed: I have personally reviewed following labs and imaging studies  CBC: Recent Labs  Lab 07/13/21 1217 07/14/21 0458 07/15/21 0545  WBC 5.1 4.9 5.9  HGB 13.6 12.5* 13.3  HCT 39.2 37.1* 38.9*  MCV 86.0 88.3 87.2  PLT 198 160 169   Basic Metabolic Panel: Recent  Labs  Lab 07/13/21 1217 07/14/21 0458 07/15/21 0545  NA 133* 140 136  K 3.3* 4.0 3.5  CL 103 109 104  CO2 '23 24 24  ' GLUCOSE 140* 91 94  BUN '10 8 7  ' CREATININE 1.00 1.00 0.97  CALCIUM 8.9 8.2* 8.5*  MG  --   --  1.9   GFR: Estimated Creatinine Clearance: 133 mL/min (by C-G formula based on SCr of 0.97 mg/dL). Liver Function Tests: Recent Labs  Lab 07/13/21 1217 07/15/21 0545  AST 67* 54*  ALT 69* 65*  ALKPHOS 76 74  BILITOT 1.1 0.8  PROT 7.0 6.9  ALBUMIN 3.8 3.6   Recent Labs  Lab 07/13/21 1217  LIPASE 29   No results for input(s): "AMMONIA" in the last 168 hours. Coagulation Profile: Recent Labs  Lab 07/13/21 1225  INR 1.2   Cardiac Enzymes: No results for input(s): "CKTOTAL", "CKMB", "CKMBINDEX", "TROPONINI" in the last 168 hours. BNP (last 3 results) No results for input(s): "PROBNP" in the last 8760 hours. HbA1C: No results for input(s): "HGBA1C" in the last 72 hours. CBG: No results for input(s): "GLUCAP" in the last 168 hours. Lipid Profile: No results for input(s): "CHOL", "HDL", "LDLCALC", "TRIG", "CHOLHDL", "LDLDIRECT" in the last 72 hours. Thyroid Function Tests: No results for input(s): "TSH", "T4TOTAL", "FREET4", "T3FREE", "THYROIDAB" in the last 72 hours. Anemia Panel: No results for input(s): "VITAMINB12", "FOLATE", "FERRITIN", "TIBC", "IRON", "RETICCTPCT" in the last 72 hours. Sepsis Labs: Recent Labs  Lab 07/13/21 1225 07/13/21 1418  LATICACIDVEN 1.4 0.8    Recent Results (from the past 240 hour(s))  SARS Coronavirus 2 by RT PCR (hospital order, performed in [facility] hospital lab) *cepheid single result test* Anterior Nasal Swab     Status: None   Collection Time: 07/13/21 11:48 AM   Specimen: Anterior Nasal Swab  Result Value Ref Range Status   SARS Coronavirus 2 by RT PCR NEGATIVE NEGATIVE Final    Comment: (NOTE) SARS-CoV-2 target nucleic acids are NOT DETECTED.  The SARS-CoV-2 RNA is generally detectable in upper and  lower respiratory specimens during the acute phase of infection. The lowest concentration of SARS-CoV-2 viral copies this assay can detect is 250 copies / mL. A negative result does not preclude SARS-CoV-2 infection and should not be used as the sole basis for treatment or other patient management decisions.  A negative result may occur with improper specimen collection / handling, submission of specimen other than nasopharyngeal swab, presence of viral mutation(s) within the areas targeted by this assay, and inadequate number of viral copies (<250 copies / mL). A negative result must be combined with clinical observations, patient history, and epidemiological information.  Fact Sheet for Patients:   https://www.patel.info/  Fact Sheet for Healthcare Providers: https://hall.com/  This test is not yet approved or  cleared by the [place] FDA and has been authorized for detection and/or diagnosis of SARS-CoV-2 by FDA under an Emergency Use Authorization (EUA).  This EUA  will remain in effect (meaning this test can be used) for the duration of the COVID-19 declaration under Section 564(b)(1) of the Act, 21 U.S.C. section 360bbb-3(b)(1), unless the authorization is terminated or revoked sooner.  Performed at [facility], [address], [place], [place] [address]   Blood Culture (routine x 2)     Status: Abnormal (Preliminary result)   Collection Time: 07/13/21 12:25 PM   Specimen: BLOOD  Result Value Ref Range Status   Specimen Description   Final    BLOOD RIGHT ANTECUBITAL Performed at [facility], [place] [address], [place], [place] [address]    Special Requests   Final    BOTTLES DRAWN AEROBIC AND ANAEROBIC Blood Culture results may not be optimal due to an excessive volume of blood received in culture bottles Performed at [place] [place], [place] [address]    Culture  Setup Time   Final    GRAM NEGATIVE  RODS ANAEROBIC BOTTLE Gram Stain Report Called to,Read Back By and Verified With: FENSKE,P'[email or web address]'  BY MATTHEWS, B 6.14.2023 CRITICAL RESULT CALLED TO, READ BACK BY AND VERIFIED WITH:  PHARMD C/ STEVEN H. 07/14/21 1405 A. LAFRANCE    Culture (A)  Final    SALMONELLA SPECIES SUSCEPTIBILITIES TO FOLLOW HEALTH DEPARTMENT NOTIFIED Performed at [facility], [place] [address], [place], [place] [address]    Report Status PENDING  Incomplete  Urine Culture     Status: None   Collection Time: 07/13/21 12:25 PM   Specimen: Urine, Clean Catch  Result Value Ref Range Status   Specimen Description   Final    URINE, CLEAN CATCH Performed at [facility], [address], [place], [place] [address]    Special Requests   Final    NONE Performed at [facility], [address] [place], [place] [address]    Culture   Final    NO GROWTH Performed at [facility], [place] [address], [place], [place] [address]    Report Status 07/14/2021 FINAL  Final  Blood Culture ID Panel (Reflexed)     Status: Abnormal   Collection Time: 07/13/21 12:25 PM  Result Value Ref Range Status   Enterococcus faecalis NOT DETECTED NOT DETECTED Final   Enterococcus Faecium NOT DETECTED NOT DETECTED Final   Listeria monocytogenes NOT DETECTED NOT DETECTED Final   Staphylococcus species NOT DETECTED NOT DETECTED Final   Staphylococcus aureus (BCID) NOT DETECTED NOT DETECTED Final   Staphylococcus epidermidis NOT DETECTED NOT DETECTED Final   Staphylococcus lugdunensis NOT DETECTED NOT DETECTED Final   Streptococcus species NOT DETECTED NOT DETECTED Final   Streptococcus agalactiae NOT DETECTED NOT DETECTED Final   Streptococcus pneumoniae NOT DETECTED NOT DETECTED Final   Streptococcus pyogenes NOT DETECTED NOT DETECTED Final   A.calcoaceticus-baumannii NOT DETECTED NOT DETECTED Final   Bacteroides fragilis NOT DETECTED NOT DETECTED Final   Enterobacterales DETECTED (A) NOT DETECTED Final    Comment:  Enterobacterales represent a large order of gram negative bacteria, not a single organism. CRITICAL RESULT CALLED TO, READ BACK BY AND VERIFIED WITH:  PHARMD C/ STEVEN H. 07/14/21 1405 A. LAFRANCE    Enterobacter cloacae complex NOT DETECTED NOT DETECTED Final   Escherichia coli NOT DETECTED NOT DETECTED Final   Klebsiella aerogenes NOT DETECTED NOT DETECTED Final   Klebsiella oxytoca NOT DETECTED NOT DETECTED Final   Klebsiella pneumoniae NOT DETECTED NOT DETECTED Final   Proteus species NOT DETECTED NOT DETECTED Final   Salmonella species DETECTED (A)  NOT DETECTED Final    Comment: CRITICAL RESULT CALLED TO, READ BACK BY AND VERIFIED WITH:  PHARMD C/ STEVEN H. 07/14/21 1405 A. LAFRANCE    Serratia marcescens NOT DETECTED NOT DETECTED Final   Haemophilus influenzae NOT DETECTED NOT DETECTED Final   Neisseria meningitidis NOT DETECTED NOT DETECTED Final   Pseudomonas aeruginosa NOT DETECTED NOT DETECTED Final   Stenotrophomonas maltophilia NOT DETECTED NOT DETECTED Final   Candida albicans NOT DETECTED NOT DETECTED Final   Candida auris NOT DETECTED NOT DETECTED Final   Candida glabrata NOT DETECTED NOT DETECTED Final   Candida krusei NOT DETECTED NOT DETECTED Final   Candida parapsilosis NOT DETECTED NOT DETECTED Final   Candida tropicalis NOT DETECTED NOT DETECTED Final   Cryptococcus neoformans/gattii NOT DETECTED NOT DETECTED Final   CTX-M ESBL NOT DETECTED NOT DETECTED Final   Carbapenem resistance IMP NOT DETECTED NOT DETECTED Final   Carbapenem resistance KPC NOT DETECTED NOT DETECTED Final   Carbapenem resistance NDM NOT DETECTED NOT DETECTED Final   Carbapenem resist OXA 48 LIKE NOT DETECTED NOT DETECTED Final   Carbapenem resistance VIM NOT DETECTED NOT DETECTED Final    Comment: Performed at [facility], [place] [address], [place], [place] [address]  Blood Culture (routine x 2)     Status: Abnormal (Preliminary result)   Collection Time: 07/13/21 12:30 PM   Specimen:  BLOOD LEFT ARM  Result Value Ref Range Status   Specimen Description   Final    BLOOD LEFT ARM Performed at [facility], [address] [place], [place] [address]    Special Requests   Final    BOTTLES DRAWN AEROBIC AND ANAEROBIC Blood Culture adequate volume Performed at [facility], [address], [place], [place] [address]    Culture  Setup Time   Final    GRAM NEGATIVE RODS ANAEROBIC AND AEROBIC BOTTLES Gram Stain Report Called to,Read Back By and Verified With: BASS,V'[email or web address]'  BY MATTHEWS, B 6.14.2023 Performed at [facility], [address], [place], [place] [address]    Culture SALMONELLA SPECIES (A)  Final   Report Status PENDING  Incomplete  C Difficile Quick Screen w PCR reflex     Status: None   Collection Time: 07/14/21  5:18 PM   Specimen: Stool  Result Value Ref Range Status   C Diff antigen NEGATIVE NEGATIVE Final   C Diff toxin NEGATIVE NEGATIVE Final   C Diff interpretation No C. difficile detected.  Final    Comment: Performed at [facility], [address] [place], [place] [address]         Radiology Studies: CT ABDOMEN PELVIS W CONTRAST  Result Date: 07/13/2021 CLINICAL DATA:  A 26 year old male presents with history of nausea vomiting and abdominal pain with diarrhea and fevers. EXAM: CT ABDOMEN AND PELVIS WITH CONTRAST TECHNIQUE: Multidetector CT imaging of the abdomen and pelvis was performed using the standard protocol following bolus administration of intravenous contrast. RADIATION DOSE REDUCTION: This exam was performed according to the departmental dose-optimization program which includes automated exposure control, adjustment of the mA and/or kV according to patient size and/or use of iterative reconstruction technique. CONTRAST:  160m OMNIPAQUE IOHEXOL 300 MG/ML  SOLN COMPARISON:  CT of the spine from August of 2021. FINDINGS: Lower chest: Lung bases are clear. No effusion or consolidation. Visualized heart is normal size. No pericardial effusion.  Hepatobiliary: No focal, suspicious hepatic lesion. No pericholecystic stranding. No biliary duct dilation. Portal vein is patent. Pancreas: Normal, without mass, inflammation or  ductal dilatation. Spleen: Normal. Adrenals/Urinary Tract: Adrenal glands are normal. Symmetric renal enhancement without hydronephrosis. No perivesical stranding. No bladder wall thickening. Stomach/Bowel: Stomach is normal by CT. Mild mural stratification of the terminal ileum is suggested best seen on coronal images Appendix top normal caliber without signs of periappendiceal stranding but with mild local nodal enlargement. Mild mural stratification of the ascending colon and to a lesser extent proximal transverse colon is also evident on today's exam. The remainder of the colon is unremarkable. No signs of pneumatosis. No pneumoperitoneum. Vascular/Lymphatic: Aorta with smooth contours. IVC with smooth contours. No aneurysmal dilation of the abdominal aorta. There is no gastrohepatic or hepatoduodenal ligament lymphadenopathy. No retroperitoneal adenopathy. RIGHT sided abdominal nodal enlargement, largest 14 mm in the ileocolic mesentery. No pelvic sidewall lymphadenopathy. . Prominence of lymph nodes in the RIGHT hemiabdomen the ileocolic and pericolonic regions are less than a cm short axis. Reproductive: Unremarkable by CT. Other: No ascites. Musculoskeletal: No acute or significant osseous findings. IMPRESSION: 1. Signs of mural stratification of the colon, mainly the ascending colon. Query mild distal small bowel thickening as well. Findings may represent enterocolitis. Correlation with infectious causes is suggested. Given potential mild mural stratification of the ileocecal valve would also consider the possibility of inflammatory bowel disease. 2. Appendiceal thickening without periappendiceal stranding is favored to be secondary and would be equivocal for acute appendiceal process in the appropriate clinical setting. 3.  Reactive lymph nodes in the RIGHT hemiabdomen. Given largest is 14 mm would consider 3 to six-month follow-up to ensure resolution and or improvement Electronically Signed   By: Zetta Bills M.D.   On: 07/13/2021 15:11   DG Chest Port 1 View  Result Date: 07/13/2021 CLINICAL DATA:  Questionable sepsis - evaluate for abnormality EXAM: PORTABLE CHEST 1 VIEW COMPARISON:  Chest radiograph May 15, 2012. FINDINGS: No consolidation. No visible pleural effusions or pneumothorax. Cardiomediastinal silhouette is within normal limits. No displaced fracture. IMPRESSION: No evidence of acute cardiopulmonary disease. Electronically Signed   By: Margaretha Sheffield M.D.   On: 07/13/2021 12:41        Scheduled Meds:  amphetamine-dextroamphetamine  20 mg Oral BID   Continuous Infusions:  cefTRIAXone (ROCEPHIN)  IV 2 g (07/14/21 1823)     LOS: 2 days    Time spent: 35 minutes    Tom Collins Darleen Crocker, DO [facility]  If 7PM-7AM, please contact night-coverage [email or web address] 07/15/2021, 12:27 PM

## 2021-07-15 NOTE — Progress Notes (Signed)
Subjective: Lower mid abdominal pain is present but significantly improved since admission.  No nausea or vomiting.  Objective: Vital signs in last 24 hours: Temp:  [98.4 F (36.9 C)-103.1 F (39.5 C)] 99.4 F (37.4 C) (06/15 0419) Pulse Rate:  [74-86] 86 (06/15 0419) Resp:  [16-18] 16 (06/15 0419) BP: (108-127)/(66-86) 108/66 (06/15 0419) SpO2:  [97 %-99 %] 97 % (06/15 0419) Last BM Date : 07/13/21  Intake/Output from previous day: 06/14 0701 - 06/15 0700 In: 720 [P.O.:720] Out: -  Intake/Output this shift: No intake/output data recorded.  General appearance: alert, cooperative, and no distress GI: Soft with some discomfort in the suprapubic region.  No specific right lower quadrant tenderness noted.  No rigidity noted.  Lab Results:  Recent Labs    07/14/21 0458 07/15/21 0545  WBC 4.9 5.9  HGB 12.5* 13.3  HCT 37.1* 38.9*  PLT 160 181   BMET Recent Labs    07/14/21 0458 07/15/21 0545  NA 140 136  K 4.0 3.5  CL 109 104  CO2 24 24  GLUCOSE 91 94  BUN 8 7  CREATININE 1.00 0.97  CALCIUM 8.2* 8.5*   PT/INR Recent Labs    07/13/21 1225  LABPROT 15.1  INR 1.2    Studies/Results: CT ABDOMEN PELVIS W CONTRAST  Result Date: 07/13/2021 CLINICAL DATA:  A 26 year old male presents with history of nausea vomiting and abdominal pain with diarrhea and fevers. EXAM: CT ABDOMEN AND PELVIS WITH CONTRAST TECHNIQUE: Multidetector CT imaging of the abdomen and pelvis was performed using the standard protocol following bolus administration of intravenous contrast. RADIATION DOSE REDUCTION: This exam was performed according to the departmental dose-optimization program which includes automated exposure control, adjustment of the mA and/or kV according to patient size and/or use of iterative reconstruction technique. CONTRAST:  OMNIPAQUE IOHEXOL 300 MG/ML  SOLN COMPARISON:  CT of the spine from August of 2021. FINDINGS: Lower chest: Lung bases are clear. No effusion or  consolidation. Visualized heart is normal size. No pericardial effusion. Hepatobiliary: No focal, suspicious hepatic lesion. No pericholecystic stranding. No biliary duct dilation. Portal vein is patent. Pancreas: Normal, without mass, inflammation or ductal dilatation. Spleen: Normal. Adrenals/Urinary Tract: Adrenal glands are normal. Symmetric renal enhancement without hydronephrosis. No perivesical stranding. No bladder wall thickening. Stomach/Bowel: Stomach is normal by CT. Mild mural stratification of the terminal ileum is suggested best seen on coronal images Appendix top normal caliber without signs of periappendiceal stranding but with mild local nodal enlargement. Mild mural stratification of the ascending colon and to a lesser extent proximal transverse colon is also evident on today's exam. The remainder of the colon is unremarkable. No signs of pneumatosis. No pneumoperitoneum. Vascular/Lymphatic: Aorta with smooth contours. IVC with smooth contours. No aneurysmal dilation of the abdominal aorta. There is no gastrohepatic or hepatoduodenal ligament lymphadenopathy. No retroperitoneal adenopathy. RIGHT sided abdominal nodal enlargement, largest 14 mm in the ileocolic mesentery. No pelvic sidewall lymphadenopathy. . Prominence of lymph nodes in the RIGHT hemiabdomen the ileocolic and pericolonic regions are less than a cm short axis. Reproductive: Unremarkable by CT. Other: No ascites. Musculoskeletal: No acute or significant osseous findings. IMPRESSION: 1. Signs of mural stratification of the colon, mainly the ascending colon. Query mild distal small bowel thickening as well. Findings may represent enterocolitis. Correlation with infectious causes is suggested. Given potential mild mural stratification of the ileocecal valve would also consider the possibility of inflammatory bowel disease. 2. Appendiceal thickening without periappendiceal stranding is favored to be secondary and would  be equivocal for  acute appendiceal process in the appropriate clinical setting. 3. Reactive lymph nodes in the RIGHT hemiabdomen. Given largest is 14 mm would consider 3 to six-month follow-up to ensure resolution and or improvement Electronically Signed   By: Donzetta Kohut M.D.   On: 07/13/2021 15:11   DG Chest Port 1 View  Result Date: 07/13/2021 CLINICAL DATA:  Questionable sepsis - evaluate for abnormality EXAM: PORTABLE CHEST 1 VIEW COMPARISON:  Chest radiograph May 15, 2012. FINDINGS: No consolidation. No visible pleural effusions or pneumothorax. Cardiomediastinal silhouette is within normal limits. No displaced fracture. IMPRESSION: No evidence of acute cardiopulmonary disease. Electronically Signed   By: Feliberto Harts M.D.   On: 07/13/2021 12:41    Anti-infectives: Anti-infectives (From admission, onward)    Start     Dose/Rate Route Frequency Ordered Stop   07/14/21 1800  cefTRIAXone (ROCEPHIN) 2 g in sodium chloride 0.9 % 100 mL IVPB        2 g 200 mL/hr over 30 Minutes Intravenous Every 24 hours 07/14/21 1422     07/13/21 2000  piperacillin-tazobactam (ZOSYN) IVPB 3.375 g  Status:  Discontinued        3.375 g 12.5 mL/hr over 240 Minutes Intravenous Every 8 hours 07/13/21 1913 07/14/21 1422   07/13/21 1600  piperacillin-tazobactam (ZOSYN) IVPB 3.375 g        3.375 g 100 mL/hr over 30 Minutes Intravenous  Once 07/13/21 1545 07/13/21 1700       Assessment/Plan: Impression: Colitis with enteritis, possible inflammatory bowel disease versus infectious etiology.  Patient does not appear to have acute appendicitis.  No need for acute surgical intervention at this time.  Will sign off.  Please call me if I can be of further assistance.  LOS: 2 days    Franky Macho 07/15/2021

## 2021-07-15 NOTE — Progress Notes (Signed)
Gastroenterology Progress Note   Referring Provider: Dr. Manuella Ghazi Primary Care Physician:  Aletha Halim., PA-C Primary Gastroenterologist:  Dr. Laural Golden, previously unassigned  Patient ID: Tom Collins; [id number]; 21-Feb-1995    Subjective   No overt GI bleeding. Last night had 1 BM. Just had another BM this morning. Noted as soft mixed with watery stool.  Has had 2 BMs since Saturday/Sunday. Had decreased oral intake as he felt nausea prior to admission. Nausea is intermittent. Mildly improved. No vomiting. Tried to eat last night and felt worsened with nausea. Had jello and juice for breakfast. Abdominal pain has improved. Still with intermittent cramping.   He tells me that he has a long-standing history of intermittent spells of diarrhea or past few years for up to a week, then normalizing. Has associated abdominal cramping with diarrhea and at times will have cramping without diarrhea on his "good weeks". He is wondering if this is normal, as he has lived with it for so long. He states he always gives himself extra time in the morning to prepare in case he has diarrhea.    Objective   Vital signs in last 24 hours Temp:  [98.4 F (36.9 C)-103.1 F (39.5 C)] 99.4 F (37.4 C) (06/15 0419) Pulse Rate:  [74-86] 86 (06/15 0419) Resp:  [16-18] 16 (06/15 0419) BP: (108-127)/(66-86) 108/66 (06/15 0419) SpO2:  [97 %-99 %] 97 % (06/15 0419) Last BM Date : 07/13/21  Physical Exam General:   Alert and oriented, pleasant Head:  Normocephalic and atraumatic. Abdomen:  Bowel sounds present, soft, mild TTP RLQ, non-distended. No HSM or hernias noted. No rebound or guarding. No masses appreciated  Extremities:  Without edema. Neurologic:  Alert and  oriented x4 Psych:  Alert and cooperative. Normal mood and affect.  Intake/Output from previous day: 06/14 0701 - 06/15 0700 In: 720 [P.O.:720] Out: -  Intake/Output this shift: No intake/output data recorded.  Lab Results  Recent Labs     06 /13/23 1217 07/14/21 0458 07/15/21 0545  WBC 5.1 4.9 5.9  HGB 13.6 12.5* 13.3  HCT 39.2 37.1* 38.9*  PLT 198 160 181   BMET Recent Labs    07/13/21 1217 07/14/21 0458 07/15/21 0545  NA 133* 140 136  K 3.3* 4.0 3.5  CL 103 109 104  CO2 23 24 24   GLUCOSE 140* 91 94  BUN 10 8 7   CREATININE 1.00 1.00 0.97  CALCIUM 8.9 8.2* 8.5*   LFT Recent Labs    07/13/21 1217 07/15/21 0545  PROT 7.0 6.9  ALBUMIN 3.8 3.6  AST 67* 54*  ALT 69* 65*  ALKPHOS 76 74  BILITOT 1.1 0.8   PT/INR Recent Labs    07/13/21 1225  LABPROT 15.1  INR 1.2    Studies/Results CT ABDOMEN PELVIS W CONTRAST  Result Date: 07/13/2021 CLINICAL DATA:  A 26 year old male presents with history of nausea vomiting and abdominal pain with diarrhea and fevers. EXAM: CT ABDOMEN AND PELVIS WITH CONTRAST TECHNIQUE: Multidetector CT imaging of the abdomen and pelvis was performed using the standard protocol following bolus administration of intravenous contrast. RADIATION DOSE REDUCTION: This exam was performed according to the departmental dose-optimization program which includes automated exposure control, adjustment of the mA and/or kV according to patient size and/or use of iterative reconstruction technique. CONTRAST:  139mL OMNIPAQUE IOHEXOL 300 MG/ML  SOLN COMPARISON:  CT of the spine from August of 2021. FINDINGS: Lower chest: Lung bases are clear. No effusion or consolidation. Visualized heart is normal size. No  pericardial effusion. Hepatobiliary: No focal, suspicious hepatic lesion. No pericholecystic stranding. No biliary duct dilation. Portal vein is patent. Pancreas: Normal, without mass, inflammation or ductal dilatation. Spleen: Normal. Adrenals/Urinary Tract: Adrenal glands are normal. Symmetric renal enhancement without hydronephrosis. No perivesical stranding. No bladder wall thickening. Stomach/Bowel: Stomach is normal by CT. Mild mural stratification of the terminal ileum is suggested best seen on  coronal images Appendix top normal caliber without signs of periappendiceal stranding but with mild local nodal enlargement. Mild mural stratification of the ascending colon and to a lesser extent proximal transverse colon is also evident on today's exam. The remainder of the colon is unremarkable. No signs of pneumatosis. No pneumoperitoneum. Vascular/Lymphatic: Aorta with smooth contours. IVC with smooth contours. No aneurysmal dilation of the abdominal aorta. There is no gastrohepatic or hepatoduodenal ligament lymphadenopathy. No retroperitoneal adenopathy. RIGHT sided abdominal nodal enlargement, largest 14 mm in the ileocolic mesentery. No pelvic sidewall lymphadenopathy. . Prominence of lymph nodes in the RIGHT hemiabdomen the ileocolic and pericolonic regions are less than a cm short axis. Reproductive: Unremarkable by CT. Other: No ascites. Musculoskeletal: No acute or significant osseous findings. IMPRESSION: 1. Signs of mural stratification of the colon, mainly the ascending colon. Query mild distal small bowel thickening as well. Findings may represent enterocolitis. Correlation with infectious causes is suggested. Given potential mild mural stratification of the ileocecal valve would also consider the possibility of inflammatory bowel disease. 2. Appendiceal thickening without periappendiceal stranding is favored to be secondary and would be equivocal for acute appendiceal process in the appropriate clinical setting. 3. Reactive lymph nodes in the RIGHT hemiabdomen. Given largest is 14 mm would consider 3 to six-month follow-up to ensure resolution and or improvement Electronically Signed   By: Donzetta Kohut M.D.   On: 07/13/2021 15:11   DG Chest Port 1 View  Result Date: 07/13/2021 CLINICAL DATA:  Questionable sepsis - evaluate for abnormality EXAM: PORTABLE CHEST 1 VIEW COMPARISON:  Chest radiograph May 15, 2012. FINDINGS: No consolidation. No visible pleural effusions or pneumothorax.  Cardiomediastinal silhouette is within normal limits. No displaced fracture. IMPRESSION: No evidence of acute cardiopulmonary disease. Electronically Signed   By: Feliberto Harts M.D.   On: 07/13/2021 12:41    Assessment  26 y.o. male presenting to the ED with one week of abdominal pain, nausea, fevers, and diarrhea. CT with enterocolitis, appendiceal thickening without periappendiceal stranding, and reactive lymphadenopathy in right hemiabdomen. GI consulted. Surgery has seen and does not feel dealing with appendicitis.   Admitted with salmonella bacteremia. Cdiff negative. GI path is in process. He has improved since admission with decreased stool output and improving abdominal pain. Tolerating clear liquids and would like to try full liquids; he does not feel ready for soft foods at this time.   Elevated transaminases: less than 2 times upper limits of normal. AST 54, ALT 65 today, improved from yesterday. Suspect more of a bystander reaction in setting of acute infection. Follow to baseline.   Interestingly, he does note a several year history of spells of diarrhea, abdominal cramping, and urgency, with  low-volume paper hematochezia. Family history significant for Crohn's disease in mother. He will need outpatient colonoscopy after acute illness resolves to rule out any underlying IBD.     Plan / Recommendations  Advance to full liquids Continue with antibiotics (ceftriaxone) Follow HFP to normalization Will need outpatient colonoscopy once over acute illness    LOS: 2 days    07/15/2021, 8:57 AM  Gelene Mink, PhD, ANP-BC  [facility]

## 2021-07-16 ENCOUNTER — Telehealth: Payer: Self-pay | Admitting: Gastroenterology

## 2021-07-16 ENCOUNTER — Encounter: Payer: Self-pay | Admitting: Gastroenterology

## 2021-07-16 DIAGNOSIS — K529 Noninfective gastroenteritis and colitis, unspecified: Secondary | ICD-10-CM | POA: Diagnosis not present

## 2021-07-16 LAB — COMPREHENSIVE METABOLIC PANEL
ALT: 78 U/L — ABNORMAL HIGH (ref 0–44)
AST: 64 U/L — ABNORMAL HIGH (ref 15–41)
Albumin: 3.6 g/dL (ref 3.5–5.0)
Alkaline Phosphatase: 71 U/L (ref 38–126)
Anion gap: 9 (ref 5–15)
BUN: 8 mg/dL (ref 6–20)
CO2: 25 mmol/L (ref 22–32)
Calcium: 8.6 mg/dL — ABNORMAL LOW (ref 8.9–10.3)
Chloride: 106 mmol/L (ref 98–111)
Creatinine, Ser: 0.9 mg/dL (ref 0.61–1.24)
GFR, Estimated: >60 mL/min (ref >60)
Glucose, Bld: 97 mg/dL (ref 70–99)
Potassium: 3.7 mmol/L (ref 3.5–5.1)
Sodium: 140 mmol/L (ref 135–145)
Total Bilirubin: 0.6 mg/dL (ref 0.3–1.2)
Total Protein: 7 g/dL (ref 6.5–8.1)

## 2021-07-16 LAB — CBC
HCT: 40.5 % (ref 39.0–52.0)
Hemoglobin: 13.7 g/dL (ref 13.0–17.0)
MCH: 29.6 pg (ref 26.0–34.0)
MCHC: 33.8 g/dL (ref 30.0–36.0)
MCV: 87.5 fL (ref 80.0–100.0)
Platelets: 189 10*3/uL (ref 150–400)
RBC: 4.63 MIL/uL (ref 4.22–5.81)
RDW: 13 % (ref 11.5–15.5)
WBC: 5.8 10*3/uL (ref 4.0–10.5)
nRBC: 0 % (ref 0.0–0.2)

## 2021-07-16 LAB — MAGNESIUM: Magnesium: 2.2 mg/dL (ref 1.7–2.4)

## 2021-07-16 LAB — CULTURE, BLOOD (ROUTINE X 2): Special Requests: ADEQUATE

## 2021-07-16 MED ORDER — CIPROFLOXACIN HCL 500 MG PO TABS: 500.0000 mg | ORAL_TABLET | Freq: Two times a day (BID) | ORAL | 0 refills | Status: AC

## 2021-07-16 NOTE — Progress Notes (Signed)
Patient alert and oriented. No complaints nausea this AM. Attempting to eat his breakfast at this time.

## 2021-07-16 NOTE — Progress Notes (Signed)
Gastroenterology Progress Note   Referring Provider: No ref. provider found Primary Care Physician:  Richmond Campbell., PA-C Primary Gastroenterologist:  Dr. Jena Gauss (newly assigned)  Patient ID: Arletha Grippe; [id number]; September 04, 1995    Subjective   Declines abdominal pain today. Denies nausea and vomiting.  Tolerated full liquids well last night and this morning.  Denies any fevers.  Still having looser stools, has had 1 today so far.  Stools are mostly watery.  Feels that he can tolerate advancing his diet.  Patient queried whether or not he could travel for his job as he usually has to travel about 2 weeks out of the month.  I advised him that he probably should wait a few weeks to travel but if he feels up to it then it should be fine.    Objective   Vital signs in last 24 hours Temp:  [98.2 F (36.8 C)-99 F (37.2 C)] 99 F (37.2 C) (06/16 0434) Pulse Rate:  [79-94] 79 (06/16 0434) Resp:  [16-18] 16 (06/16 0434) BP: (101-134)/(63-81) 101/63 (06/16 0434) SpO2:  [98 %-100 %] 98 % (06/16 0434) Last BM Date : 07/15/21  Physical Exam General:   Alert and oriented, pleasant Head:  Normocephalic and atraumatic. Eyes:  No icterus, sclera clear. Conjuctiva pink.  Mouth:  Without lesions, mucosa pink and moist.  Neck:  Supple, without thyromegaly or masses.  Heart:  S1, S2 present, no murmurs noted.  Lungs: Clear to auscultation bilaterally, without wheezing, rales, or rhonchi.  Abdomen:  Bowel sounds present, soft, mildly tender to periumbilical region non-distended. No HSM or hernias noted. No rebound or guarding. No masses appreciated  Msk:  Symmetrical without gross deformities. Normal posture. Pulses:  Normal pulses noted. Extremities:  Without clubbing or edema. Neurologic:  Alert and  oriented x4;  grossly normal neurologically. Skin:  Warm and dry, intact without significant lesions.  Cervical Nodes:  No significant cervical adenopathy. Psych:  Alert and cooperative.  Normal mood and affect.  Intake/Output from previous day: 06/15 0701 - 06/16 0700 In: 2480 [P.O.:2280; IV Piggyback:200] Out: -  Intake/Output this shift: Total I/O In: 960 [P.O.:960] Out: -   Lab Results  Recent Labs    07/14/21 0458 07/15/21 0545 07/16/21 0428  WBC 4.9 5.9 5.8  HGB 12.5* 13.3 13.7  HCT 37.1* 38.9* 40.5  PLT 160 181 189   BMET Recent Labs    07/14/21 0458 07/15/21 0545 07/16/21 0428  NA 140 136 140  K 4.0 3.5 3.7  CL 109 104 106  CO2 24 24 25   GLUCOSE 91 94 97  BUN 8 7 8   CREATININE 1.00 0.97 0.90  CALCIUM 8.2* 8.5* 8.6*   LFT Recent Labs    07/13/21 1217 07/15/21 0545 07/16/21 0428  PROT 7.0 6.9 7.0  ALBUMIN 3.8 3.6 3.6  AST 67* 54* 64*  ALT 69* 65* 78*  ALKPHOS 76 74 71  BILITOT 1.1 0.8 0.6   PT/INR Recent Labs    07/13/21 1225  LABPROT 15.1  INR 1.2   Hepatitis Panel No results for input(s): "HEPBSAG", "HCVAB", "HEPAIGM", "HEPBIGM" in the last 72 hours.  Studies/Results CT ABDOMEN PELVIS W CONTRAST  Result Date: 07/13/2021 CLINICAL DATA:  A 26 year old male presents with history of nausea vomiting and abdominal pain with diarrhea and fevers. EXAM: CT ABDOMEN AND PELVIS WITH CONTRAST TECHNIQUE: Multidetector CT imaging of the abdomen and pelvis was performed using the standard protocol following bolus administration of intravenous contrast. RADIATION DOSE REDUCTION: This exam was performed  according to the departmental dose-optimization program which includes automated exposure control, adjustment of the mA and/or kV according to patient size and/or use of iterative reconstruction technique. CONTRAST:  OMNIPAQUE IOHEXOL 300 MG/ML  SOLN COMPARISON:  CT of the spine from August of 2021. FINDINGS: Lower chest: Lung bases are clear. No effusion or consolidation. Visualized heart is normal size. No pericardial effusion. Hepatobiliary: No focal, suspicious hepatic lesion. No pericholecystic stranding. No biliary duct dilation. Portal  vein is patent. Pancreas: Normal, without mass, inflammation or ductal dilatation. Spleen: Normal. Adrenals/Urinary Tract: Adrenal glands are normal. Symmetric renal enhancement without hydronephrosis. No perivesical stranding. No bladder wall thickening. Stomach/Bowel: Stomach is normal by CT. Mild mural stratification of the terminal ileum is suggested best seen on coronal images Appendix top normal caliber without signs of periappendiceal stranding but with mild local nodal enlargement. Mild mural stratification of the ascending colon and to a lesser extent proximal transverse colon is also evident on today's exam. The remainder of the colon is unremarkable. No signs of pneumatosis. No pneumoperitoneum. Vascular/Lymphatic: Aorta with smooth contours. IVC with smooth contours. No aneurysmal dilation of the abdominal aorta. There is no gastrohepatic or hepatoduodenal ligament lymphadenopathy. No retroperitoneal adenopathy. RIGHT sided abdominal nodal enlargement, largest 14 mm in the ileocolic mesentery. No pelvic sidewall lymphadenopathy. . Prominence of lymph nodes in the RIGHT hemiabdomen the ileocolic and pericolonic regions are less than a cm short axis. Reproductive: Unremarkable by CT. Other: No ascites. Musculoskeletal: No acute or significant osseous findings. IMPRESSION: 1. Signs of mural stratification of the colon, mainly the ascending colon. Query mild distal small bowel thickening as well. Findings may represent enterocolitis. Correlation with infectious causes is suggested. Given potential mild mural stratification of the ileocecal valve would also consider the possibility of inflammatory bowel disease. 2. Appendiceal thickening without periappendiceal stranding is favored to be secondary and would be equivocal for acute appendiceal process in the appropriate clinical setting. 3. Reactive lymph nodes in the RIGHT hemiabdomen. Given largest is 14 mm would consider 3 to six-month follow-up to ensure  resolution and or improvement Electronically Signed   By: Donzetta Kohut M.D.   On: 07/13/2021 15:11   DG Chest Port 1 View  Result Date: 07/13/2021 CLINICAL DATA:  Questionable sepsis - evaluate for abnormality EXAM: PORTABLE CHEST 1 VIEW COMPARISON:  Chest radiograph May 15, 2012. FINDINGS: No consolidation. No visible pleural effusions or pneumothorax. Cardiomediastinal silhouette is within normal limits. No displaced fracture. IMPRESSION: No evidence of acute cardiopulmonary disease. Electronically Signed   By: Feliberto Harts M.D.   On: 07/13/2021 12:41    Assessment  26 y.o. male with a history of 1 week of abdominal pain, nausea, fevers, and diarrhea.  CT with enterocolitis, appendiceal thickening without periappendiceal stranding, and reactive lymphadenopathy in right hemiabdomen.  GI consulted.  Surgery is seen does not feel like he has appendicitis.  Admitted with Salmonella bacteremia.  C. difficile negative.  GI pathogen panel with Salmonella detected.  Blood cultures positive for Salmonella and Enterobacterales.  He has improved since admission and has had decreased stool output and today his abdominal pain has mostly resolved.  He has been tolerating full liquids yesterday evening and this morning.  He has remained fever free.  He would like to attempt soft foods.  If you tolerate soft foods he could potentially be discharged in the near future.  Elevated transaminases: LFTs remain elevated but less than 2 times the upper limit of normal.  Today's LFTs - AST 64 (54),  ALT 78 (65).  AST 67 and ALT 69 on admission.  Likely reactive from acute infection.  If patient tolerates diet and is discharged today would recommend following up hepatic function panel in 1 to 2 weeks, I have sent a message to our office to arrange.  Patient has noted history of intermittent bouts of diarrhea with abdominal cramping and urgency with low-volume paper hematochezia.  Patient states he may have had a little  bit of blood tinge yesterday evening.  He does have a family history of Crohn's disease in his mother.  I have arranged hospital follow-up for the patient in 3 to 4 weeks.  He should have outpatient colonoscopy in 6 to 8 weeks to rule out underlying IBD.    Plan / Recommendations  Advance to soft diet Transition IV ceftriaxone to oral Cipro, to complete at least 10 days of therapy. HFP in 1 to 2 weeks Outpatient colonoscopy Hospital follow-up in 3 to 4 weeks    LOS: 3 days    07/16/2021, 11:39 AM   Brooke Bonito, MSN, FNP-BC, AGACNP-BC [facility]

## 2021-07-16 NOTE — Progress Notes (Signed)
Nursing Discharge Note  Admit Date:  07/13/2021 Discharge date: 07/16/2021   Arletha Grippe to be D/C'd Home per MD order.  AVS completed. Patient/caregiver able to verbalize understanding.  Discharge Medication: Allergies as of 07/16/2021       Reactions   Bee Venom    Other    Raw vegetables trigger swelling,         Medication List     TAKE these medications    acetaminophen 500 MG tablet Commonly known as: TYLENOL Take 1,000 mg by mouth every 6 (six) hours as needed.   amphetamine-dextroamphetamine 20 MG tablet Commonly known as: ADDERALL Take 20 mg by mouth 2 (two) times daily.   ciprofloxacin 500 MG tablet Commonly known as: Cipro Take 1 tablet (500 mg total) by mouth 2 (two) times daily for 7 days.   hydrocortisone 25 MG suppository Commonly known as: ANUSOL-HC Place 1 suppository (25 mg total) rectally 2 (two) times daily.   ibuprofen 200 MG tablet Commonly known as: ADVIL Take 800 mg by mouth every 6 (six) hours as needed.   temazepam 22.5 MG capsule Commonly known as: RESTORIL Take 22.5 mg by mouth at bedtime.   XYZAL ALLERGY 24HR PO Take 1 tablet by mouth daily.        Discharge Assessment: Vitals:   07/15/21 2114 07/16/21 0434  BP: 119/81 101/63  Pulse: 87 79  Resp: 16 16  Temp: 98.7 F (37.1 C) 99 F (37.2 C)  SpO2: 99% 98%   kin clean, dry and intact without evidence of skin break down, no evidence of skin tears noted. IV catheter discontinued intact. Site without signs and symptoms of complications - no redness or edema noted at insertion site, patient denies c/o pain - only slight tenderness at site.  Dressing with slight pressure applied.  D/c Instructions-Education: Discharge instructions given to patient/family with verbalized understanding. D/c education completed with patient/family including follow up instructions, medication list, d/c activities limitations if indicated, with other d/c instructions as indicated by MD - patient  able to verbalize understanding, all questions fully answered. Patient instructed to return to ED, call 911, or call MD for any changes in condition.  Patient escorted via WC, and D/C home via private auto.  Luther Bradley, RN 07/16/2021 1:48 PM

## 2021-07-16 NOTE — Progress Notes (Signed)
Patient in restroom at time of rounding. Speaking on the phone.

## 2021-07-16 NOTE — Plan of Care (Signed)

## 2021-07-16 NOTE — Telephone Encounter (Signed)
Stacy -please arrange hospital follow-up for this patient for Salmonella infection and chronic diarrhea.  Hilarie Sinha -please arrange for hepatic function panel to be completed in the next week or 2.  Brooke Bonito, MSN, FNP-BC, AGACNP-BC [facility]

## 2021-07-16 NOTE — Discharge Summary (Signed)
Physician Discharge Summary  Tom Collins NWG:[id number] DOB: 08-06-1995 DOA: 07/13/2021  PCP: Tom Collins., PA-C  Admit date: 07/13/2021  Discharge date: 07/16/2021  Admitted From:Home  Disposition:  Home  Recommendations for Outpatient Follow-up:  Follow up with PCP in 1-2 weeks Follow-up with GI in 1-2 weeks to follow-up LFTs  Continue on ciprofloxacin for 7 more days to complete treatment for Salmonella enterocolitis with bacteremia Continue other home medications as prior  Home Health: None  Equipment/Devices: None  Discharge Condition:Stable  CODE STATUS: Full  Diet recommendation: Regular  Brief/Interim Summary: Tom Collins is a 26 y.o. male with medical history significant for asthma and depression/anxiety. Patient presented to the ED with complaints of about 1 week of abdominal pain, he also reports diarrhea and fevers up to 102 at home.  Patient was admitted with diffuse colitis and suspicion for inflammatory bowel disease.  No need for operative intervention noted.  GI consulted for further recommendations with plans for likely outpatient colonoscopy.  He was noted to have Salmonella enterocolitis with associated bacteremia and continued to remain febrile for a couple days during the course of this hospitalization while on IV antibiotics.  He was followed by GI and is now eligible for transition to oral antibiotics with ciprofloxacin which will be continued for another 7 days to complete treatment.  No other acute events or concerns noted and he is stable for discharge with plans for follow-up to GI in the next 1-2 weeks to monitor his mild transaminitis.  Discharge Diagnoses:  Principal Problem:   Colitis Active Problems:   ADD (attention deficit disorder)   Anxiety  Principal discharge diagnosis: Salmonella enterocolitis with associated bacteremia  Discharge Instructions  Discharge Instructions     Diet - low sodium heart healthy   Complete by: As  directed    Increase activity slowly   Complete by: As directed       Allergies as of 07/16/2021       Reactions   Bee Venom    Other    Raw vegetables trigger swelling,         Medication List     TAKE these medications    acetaminophen 500 MG tablet Commonly known as: TYLENOL Take 1,000 mg by mouth every 6 (six) hours as needed.   amphetamine-dextroamphetamine 20 MG tablet Commonly known as: ADDERALL Take 20 mg by mouth 2 (two) times daily.   ciprofloxacin 500 MG tablet Commonly known as: Cipro Take 1 tablet (500 mg total) by mouth 2 (two) times daily for 7 days.   hydrocortisone 25 MG suppository Commonly known as: ANUSOL-HC Place 1 suppository (25 mg total) rectally 2 (two) times daily.   ibuprofen 200 MG tablet Commonly known as: ADVIL Take 800 mg by mouth every 6 (six) hours as needed.   temazepam 22.5 MG capsule Commonly known as: RESTORIL Take 22.5 mg by mouth at bedtime.   XYZAL ALLERGY 24HR PO Take 1 tablet by mouth daily.        Follow-up Information     Tom Collins., PA-C. Schedule an appointment as soon as possible for a visit in 1 week(s).   Specialty: Family Medicine Contact information: [address] [place] [address] [phone]         [facility]. Go to.   Contact information: [address] [place] [address] [phone]               Allergies  Allergen Reactions   Bee Venom  Other     Raw vegetables trigger swelling,     Consultations: GI   Procedures/Studies: CT ABDOMEN PELVIS W CONTRAST  Result Date: 07/13/2021 CLINICAL DATA:  A 26 year old male presents with history of nausea vomiting and abdominal pain with diarrhea and fevers. EXAM: CT ABDOMEN AND PELVIS WITH CONTRAST TECHNIQUE: Multidetector CT imaging of the abdomen and pelvis was performed using the standard protocol following bolus administration of intravenous contrast. RADIATION DOSE  REDUCTION: This exam was performed according to the departmental dose-optimization program which includes automated exposure control, adjustment of the mA and/or kV according to patient size and/or use of iterative reconstruction technique. CONTRAST:  187mL OMNIPAQUE IOHEXOL 300 MG/ML  SOLN COMPARISON:  CT of the spine from August of 2021. FINDINGS: Lower chest: Lung bases are clear. No effusion or consolidation. Visualized heart is normal size. No pericardial effusion. Hepatobiliary: No focal, suspicious hepatic lesion. No pericholecystic stranding. No biliary duct dilation. Portal vein is patent. Pancreas: Normal, without mass, inflammation or ductal dilatation. Spleen: Normal. Adrenals/Urinary Tract: Adrenal glands are normal. Symmetric renal enhancement without hydronephrosis. No perivesical stranding. No bladder wall thickening. Stomach/Bowel: Stomach is normal by CT. Mild mural stratification of the terminal ileum is suggested best seen on coronal images Appendix top normal caliber without signs of periappendiceal stranding but with mild local nodal enlargement. Mild mural stratification of the ascending colon and to a lesser extent proximal transverse colon is also evident on today's exam. The remainder of the colon is unremarkable. No signs of pneumatosis. No pneumoperitoneum. Vascular/Lymphatic: Aorta with smooth contours. IVC with smooth contours. No aneurysmal dilation of the abdominal aorta. There is no gastrohepatic or hepatoduodenal ligament lymphadenopathy. No retroperitoneal adenopathy. RIGHT sided abdominal nodal enlargement, largest 14 mm in the ileocolic mesentery. No pelvic sidewall lymphadenopathy. . Prominence of lymph nodes in the RIGHT hemiabdomen the ileocolic and pericolonic regions are less than a cm short axis. Reproductive: Unremarkable by CT. Other: No ascites. Musculoskeletal: No acute or significant osseous findings. IMPRESSION: 1. Signs of mural stratification of the colon, mainly  the ascending colon. Query mild distal small bowel thickening as well. Findings may represent enterocolitis. Correlation with infectious causes is suggested. Given potential mild mural stratification of the ileocecal valve would also consider the possibility of inflammatory bowel disease. 2. Appendiceal thickening without periappendiceal stranding is favored to be secondary and would be equivocal for acute appendiceal process in the appropriate clinical setting. 3. Reactive lymph nodes in the RIGHT hemiabdomen. Given largest is 14 mm would consider 3 to six-month follow-up to ensure resolution and or improvement Electronically Signed   By: Zetta Bills M.D.   On: 07/13/2021 15:11   DG Chest Port 1 View  Result Date: 07/13/2021 CLINICAL DATA:  Questionable sepsis - evaluate for abnormality EXAM: PORTABLE CHEST 1 VIEW COMPARISON:  Chest radiograph May 15, 2012. FINDINGS: No consolidation. No visible pleural effusions or pneumothorax. Cardiomediastinal silhouette is within normal limits. No displaced fracture. IMPRESSION: No evidence of acute cardiopulmonary disease. Electronically Signed   By: Margaretha Sheffield M.D.   On: 07/13/2021 12:41     Discharge Exam: Vitals:   07/15/21 2114 07/16/21 0434  BP: 119/81 101/63  Pulse: 87 79  Resp: 16 16  Temp: 98.7 F (37.1 C) 99 F (37.2 C)  SpO2: 99% 98%   Vitals:   07/15/21 0419 07/15/21 1700 07/15/21 2114 07/16/21 0434  BP: 108/66 134/81 119/81 101/63  Pulse: 86 94 87 79  Resp: 16 18 16 16   Temp: 99.4 F (37.4 C) 98.2  F (36.8 C) 98.7 F (37.1 C) 99 F (37.2 C)  TempSrc: Oral Axillary Oral Oral  SpO2: 97% 100% 99% 98%  Weight:      Height:        General: Pt is alert, awake, not in acute distress Cardiovascular: RRR, S1/S2 +, no rubs, no gallops Respiratory: CTA bilaterally, no wheezing, no rhonchi Abdominal: Soft, NT, ND, bowel sounds + Extremities: no edema, no cyanosis    The results of significant diagnostics from this  hospitalization (including imaging, microbiology, ancillary and laboratory) are listed below for reference.     Microbiology: Recent Results (from the past 240 hour(s))  SARS Coronavirus 2 by RT PCR (hospital order, performed in [facility] hospital lab) *cepheid single result test* Anterior Nasal Swab     Status: None   Collection Time: 07/13/21 11:48 AM   Specimen: Anterior Nasal Swab  Result Value Ref Range Status   SARS Coronavirus 2 by RT PCR NEGATIVE NEGATIVE Final    Comment: (NOTE) SARS-CoV-2 target nucleic acids are NOT DETECTED.  The SARS-CoV-2 RNA is generally detectable in upper and lower respiratory specimens during the acute phase of infection. The lowest concentration of SARS-CoV-2 viral copies this assay can detect is 250 copies / mL. A negative result does not preclude SARS-CoV-2 infection and should not be used as the sole basis for treatment or other patient management decisions.  A negative result may occur with improper specimen collection / handling, submission of specimen other than nasopharyngeal swab, presence of viral mutation(s) within the areas targeted by this assay, and inadequate number of viral copies (<250 copies / mL). A negative result must be combined with clinical observations, patient history, and epidemiological information.  Fact Sheet for Patients:   https://www.patel.info/  Fact Sheet for Healthcare Providers: https://hall.com/  This test is not yet approved or  cleared by the [place] FDA and has been authorized for detection and/or diagnosis of SARS-CoV-2 by FDA under an Emergency Use Authorization (EUA).  This EUA will remain in effect (meaning this test can be used) for the duration of the COVID-19 declaration under Section 564(b)(1) of the Act, 21 U.S.C. section 360bbb-3(b)(1), unless the authorization is terminated or revoked sooner.  Performed at [facility], [address]  [place], [place] [address]   Blood Culture (routine x 2)     Status: Abnormal (Preliminary result)   Collection Time: 07/13/21 12:25 PM   Specimen: BLOOD  Result Value Ref Range Status   Specimen Description   Final    BLOOD RIGHT ANTECUBITAL Performed at [facility], [place] [address], [place], [place] [address]    Special Requests   Final    BOTTLES DRAWN AEROBIC AND ANAEROBIC Blood Culture results may not be optimal due to an excessive volume of blood received in culture bottles Performed at [place] [place], [place] [address]    Culture  Setup Time   Final    GRAM NEGATIVE RODS ANAEROBIC BOTTLE Gram Stain Report Called to,Read Back By and Verified With: FENSKE,[id number]  BY MATTHEWS, B 6.14.2023 CRITICAL RESULT CALLED TO, READ BACK BY AND VERIFIED WITH:  PHARMD C/ STEVEN H. 07/14/21 1405 A. LAFRANCE    Culture (A)  Final    SALMONELLA SPECIES HEALTH DEPARTMENT NOTIFIED REFERRED TO [place] IN [place] IDENTIFICATION/CONFIRMATION Performed at [facility], [place] [address] [place], [place] [address]    Report Status PENDING  Incomplete   Organism ID, Bacteria SALMONELLA  SPECIES  Final      Susceptibility   Salmonella species - MIC*    AMPICILLIN <=2 SENSITIVE Sensitive     LEVOFLOXACIN <=0.12 SENSITIVE Sensitive     TRIMETH/SULFA <=20 SENSITIVE Sensitive     * SALMONELLA SPECIES  Urine Culture     Status: None   Collection Time: 07/13/21 12:25 PM   Specimen: Urine, Clean Catch  Result Value Ref Range Status   Specimen Description   Final    URINE, CLEAN CATCH Performed at [facility], [address] [place], [place] [address]    Special Requests   Final    NONE Performed at [facility], [address] [place], [place] [address]    Culture   Final    NO GROWTH Performed at [facility], [place] [address] [place], [place] [address]    Report Status 07/14/2021 FINAL  Final  Blood Culture ID Panel  (Reflexed)     Status: Abnormal   Collection Time: 07/13/21 12:25 PM  Result Value Ref Range Status   Enterococcus faecalis NOT DETECTED NOT DETECTED Final   Enterococcus Faecium NOT DETECTED NOT DETECTED Final   Listeria monocytogenes NOT DETECTED NOT DETECTED Final   Staphylococcus species NOT DETECTED NOT DETECTED Final   Staphylococcus aureus (BCID) NOT DETECTED NOT DETECTED Final   Staphylococcus epidermidis NOT DETECTED NOT DETECTED Final   Staphylococcus lugdunensis NOT DETECTED NOT DETECTED Final   Streptococcus species NOT DETECTED NOT DETECTED Final   Streptococcus agalactiae NOT DETECTED NOT DETECTED Final   Streptococcus pneumoniae NOT DETECTED NOT DETECTED Final   Streptococcus pyogenes NOT DETECTED NOT DETECTED Final   A.calcoaceticus-baumannii NOT DETECTED NOT DETECTED Final   Bacteroides fragilis NOT DETECTED NOT DETECTED Final   Enterobacterales DETECTED (A) NOT DETECTED Final    Comment: Enterobacterales represent a large order of gram negative bacteria, not a single organism. CRITICAL RESULT CALLED TO, READ BACK BY AND VERIFIED WITH:  PHARMD C/ STEVEN H. 07/14/21 1405 A. LAFRANCE    Enterobacter cloacae complex NOT DETECTED NOT DETECTED Final   Escherichia coli NOT DETECTED NOT DETECTED Final   Klebsiella aerogenes NOT DETECTED NOT DETECTED Final   Klebsiella oxytoca NOT DETECTED NOT DETECTED Final   Klebsiella pneumoniae NOT DETECTED NOT DETECTED Final   Proteus species NOT DETECTED NOT DETECTED Final   Salmonella species DETECTED (A) NOT DETECTED Final    Comment: CRITICAL RESULT CALLED TO, READ BACK BY AND VERIFIED WITH:  PHARMD C/ STEVEN H. 07/14/21 1405 A. LAFRANCE    Serratia marcescens NOT DETECTED NOT DETECTED Final   Haemophilus influenzae NOT DETECTED NOT DETECTED Final   Neisseria meningitidis NOT DETECTED NOT DETECTED Final   Pseudomonas aeruginosa NOT DETECTED NOT DETECTED Final   Stenotrophomonas maltophilia NOT DETECTED NOT DETECTED Final    Candida albicans NOT DETECTED NOT DETECTED Final   Candida auris NOT DETECTED NOT DETECTED Final   Candida glabrata NOT DETECTED NOT DETECTED Final   Candida krusei NOT DETECTED NOT DETECTED Final   Candida parapsilosis NOT DETECTED NOT DETECTED Final   Candida tropicalis NOT DETECTED NOT DETECTED Final   Cryptococcus neoformans/gattii NOT DETECTED NOT DETECTED Final   CTX-M ESBL NOT DETECTED NOT DETECTED Final   Carbapenem resistance IMP NOT DETECTED NOT DETECTED Final   Carbapenem resistance KPC NOT DETECTED NOT DETECTED Final   Carbapenem resistance NDM NOT DETECTED NOT DETECTED Final   Carbapenem resist OXA 48 LIKE NOT DETECTED NOT DETECTED Final   Carbapenem resistance VIM NOT DETECTED NOT DETECTED Final  Comment: Performed at [facility], [address] [place], [place] [address]  Blood Culture (routine x 2)     Status: Abnormal   Collection Time: 07/13/21 12:30 PM   Specimen: BLOOD LEFT ARM  Result Value Ref Range Status   Specimen Description   Final    BLOOD LEFT ARM Performed at [facility], [address], [place], [place] [address]    Special Requests   Final    BOTTLES DRAWN AEROBIC AND ANAEROBIC Blood Culture adequate volume Performed at [facility], [address], [place], [place] [address]    Culture  Setup Time   Final    GRAM NEGATIVE RODS ANAEROBIC AND AEROBIC BOTTLES Gram Stain Report Called to,Read Back By and Verified With: BASS,[id number]  BY MATTHEWS, B 6.14.2023 Performed at [facility], [address] [place], [place] [address]    Culture (A)  Final    SALMONELLA SPECIES SUSCEPTIBILITIES PERFORMED ON PREVIOUS CULTURE WITHIN THE LAST 5 DAYS. Performed at [facility], [address] [place], [place] [address]    Report Status 07/16/2021 FINAL  Final  Gastrointestinal Panel by PCR , Stool     Status: Abnormal   Collection Time: 07/13/21  5:28 PM   Specimen: Stool  Result Value Ref Range Status   Campylobacter species NOT DETECTED NOT  DETECTED Final   Plesimonas shigelloides NOT DETECTED NOT DETECTED Final   Salmonella species DETECTED (A) NOT DETECTED Final    Comment: RESULT CALLED TO, READ BACK BY AND VERIFIED WITH: SHANNON BROWN 07/15/21 1448 KLW    Yersinia enterocolitica NOT DETECTED NOT DETECTED Final   Vibrio species NOT DETECTED NOT DETECTED Final   Vibrio cholerae NOT DETECTED NOT DETECTED Final   Enteroaggregative E coli (EAEC) NOT DETECTED NOT DETECTED Final   Enteropathogenic E coli (EPEC) NOT DETECTED NOT DETECTED Final   Enterotoxigenic E coli (ETEC) NOT DETECTED NOT DETECTED Final   Shiga like toxin producing E coli (STEC) NOT DETECTED NOT DETECTED Final   Shigella/Enteroinvasive E coli (EIEC) NOT DETECTED NOT DETECTED Final   Cryptosporidium NOT DETECTED NOT DETECTED Final   Cyclospora cayetanensis NOT DETECTED NOT DETECTED Final   Entamoeba histolytica NOT DETECTED NOT DETECTED Final   Giardia lamblia NOT DETECTED NOT DETECTED Final   Adenovirus F40/41 NOT DETECTED NOT DETECTED Final   Astrovirus NOT DETECTED NOT DETECTED Final   Norovirus GI/GII NOT DETECTED NOT DETECTED Final   Rotavirus A NOT DETECTED NOT DETECTED Final   Sapovirus (I, II, IV, and V) NOT DETECTED NOT DETECTED Final    Comment: Performed at [facility], [address] [place], [place] [address]  C Difficile Quick Screen w PCR reflex     Status: None   Collection Time: 07/14/21  5:18 PM   Specimen: Stool  Result Value Ref Range Status   C Diff antigen NEGATIVE NEGATIVE Final   C Diff toxin NEGATIVE NEGATIVE Final   C Diff interpretation No C. difficile detected.  Final    Comment: Performed at [facility], [address] [place], [place] [address]     Labs: BNP (last 3 results) No results for input(s): "BNP" in the last 8760 hours. Basic Metabolic Panel: Recent Labs  Lab 07/13/21 1217 07/14/21 0458 07/15/21 0545 07/16/21 0428  NA 133* 140 136 140  K 3.3* 4.0 3.5 3.7  CL 103 109 104 106  CO2 23 24 24 25    GLUCOSE 140* 91 94 97  BUN 10 8 7 8   CREATININE 1.00  1.00 0.97 0.90  CALCIUM 8.9 8.2* 8.5* 8.6*  MG  --   --  1.9 2.2   Liver Function Tests: Recent Labs  Lab 07/13/21 1217 07/15/21 0545 07/16/21 0428  AST 67* 54* 64*  ALT 69* 65* 78*  ALKPHOS 76 74 71  BILITOT 1.1 0.8 0.6  PROT 7.0 6.9 7.0  ALBUMIN 3.8 3.6 3.6   Recent Labs  Lab 07/13/21 1217  LIPASE 29   No results for input(s): "AMMONIA" in the last 168 hours. CBC: Recent Labs  Lab 07/13/21 1217 07/14/21 0458 07/15/21 0545 07/16/21 0428  WBC 5.1 4.9 5.9 5.8  HGB 13.6 12.5* 13.3 13.7  HCT 39.2 37.1* 38.9* 40.5  MCV 86.0 88.3 87.2 87.5  PLT 198 160 181 189   Cardiac Enzymes: No results for input(s): "CKTOTAL", "CKMB", "CKMBINDEX", "TROPONINI" in the last 168 hours. BNP: Invalid input(s): "POCBNP" CBG: No results for input(s): "GLUCAP" in the last 168 hours. D-Dimer No results for input(s): "DDIMER" in the last 72 hours. Hgb A1c No results for input(s): "HGBA1C" in the last 72 hours. Lipid Profile No results for input(s): "CHOL", "HDL", "LDLCALC", "TRIG", "CHOLHDL", "LDLDIRECT" in the last 72 hours. Thyroid function studies No results for input(s): "TSH", "T4TOTAL", "T3FREE", "THYROIDAB" in the last 72 hours.  Invalid input(s): "FREET3" Anemia work up No results for input(s): "VITAMINB12", "FOLATE", "FERRITIN", "TIBC", "IRON", "RETICCTPCT" in the last 72 hours. Urinalysis    Component Value Date/Time   COLORURINE YELLOW 07/13/2021 1148   APPEARANCEUR CLEAR 07/13/2021 1148   LABSPEC 1.020 07/13/2021 1148   PHURINE 7.0 07/13/2021 1148   GLUCOSEU NEGATIVE 07/13/2021 1148   HGBUR NEGATIVE 07/13/2021 1148   BILIRUBINUR NEGATIVE 07/13/2021 1148   KETONESUR NEGATIVE 07/13/2021 1148   PROTEINUR 100 (A) 07/13/2021 1148   NITRITE NEGATIVE 07/13/2021 1148   LEUKOCYTESUR NEGATIVE 07/13/2021 1148   Sepsis Labs Recent Labs  Lab 07/13/21 1217 07/14/21 0458 07/15/21 0545 07/16/21 0428  WBC 5.1 4.9 5.9  5.8   Microbiology Recent Results (from the past 240 hour(s))  SARS Coronavirus 2 by RT PCR (hospital order, performed in [place] hospital lab) *cepheid single result test* Anterior Nasal Swab     Status: None   Collection Time: 07/13/21 11:48 AM   Specimen: Anterior Nasal Swab  Result Value Ref Range Status   SARS Coronavirus 2 by RT PCR NEGATIVE NEGATIVE Final    Comment: (NOTE) SARS-CoV-2 target nucleic acids are NOT DETECTED.  The SARS-CoV-2 RNA is generally detectable in upper and lower respiratory specimens during the acute phase of infection. The lowest concentration of SARS-CoV-2 viral copies this assay can detect is 250 copies / mL. A negative result does not preclude SARS-CoV-2 infection and should not be used as the sole basis for treatment or other patient management decisions.  A negative result may occur with improper specimen collection / handling, submission of specimen other than nasopharyngeal swab, presence of viral mutation(s) within the areas targeted by this assay, and inadequate number of viral copies (<250 copies / mL). A negative result must be combined with clinical observations, patient history, and epidemiological information.  Fact Sheet for Patients:   https://www.patel.info/  Fact Sheet for Healthcare Providers: https://hall.com/  This test is not yet approved or  cleared by the [place] FDA and has been authorized for detection and/or diagnosis of SARS-CoV-2 by FDA under an Emergency Use Authorization (EUA).  This EUA will remain in effect (meaning this test can be used) for the duration of the COVID-19 declaration under Section  564(b)(1) of the Act, 21 U.S.C. section 360bbb-3(b)(1), unless the authorization is terminated or revoked sooner.  Performed at [facility], [address] [place], [place] [address]   Blood Culture (routine x 2)     Status: Abnormal (Preliminary result)   Collection  Time: 07/13/21 12:25 PM   Specimen: BLOOD  Result Value Ref Range Status   Specimen Description   Final    BLOOD RIGHT ANTECUBITAL Performed at [facility], [place] [address], [place], [place] [address]    Special Requests   Final    BOTTLES DRAWN AEROBIC AND ANAEROBIC Blood Culture results may not be optimal due to an excessive volume of blood received in culture bottles Performed at [place] [place], [place] [address]    Culture  Setup Time   Final    GRAM NEGATIVE RODS ANAEROBIC BOTTLE Gram Stain Report Called to,Read Back By and Verified With: FENSKE,[id number]  BY MATTHEWS, B 6.14.2023 CRITICAL RESULT CALLED TO, READ BACK BY AND VERIFIED WITH:  PHARMD C/ STEVEN H. 07/14/21 1405 A. LAFRANCE    Culture (A)  Final    SALMONELLA SPECIES HEALTH DEPARTMENT NOTIFIED REFERRED TO [place] IN [place] IDENTIFICATION/CONFIRMATION Performed at [facility], [place] [address] [place], [place] [address]    Report Status PENDING  Incomplete   Organism ID, Bacteria SALMONELLA SPECIES  Final      Susceptibility   Salmonella species - MIC*    AMPICILLIN <=2 SENSITIVE Sensitive     LEVOFLOXACIN <=0.12 SENSITIVE Sensitive     TRIMETH/SULFA <=20 SENSITIVE Sensitive     * SALMONELLA SPECIES  Urine Culture     Status: None   Collection Time: 07/13/21 12:25 PM   Specimen: Urine, Clean Catch  Result Value Ref Range Status   Specimen Description   Final    URINE, CLEAN CATCH Performed at [facility], [address], [place], [place] [address]    Special Requests   Final    NONE Performed at [facility], [address] [place], [place] [address]    Culture   Final    NO GROWTH Performed at [facility], [place] [address] [place], [place] [address]    Report Status 07/14/2021 FINAL  Final  Blood Culture ID Panel (Reflexed)     Status: Abnormal   Collection Time: 07/13/21 12:25 PM  Result Value Ref Range Status    Enterococcus faecalis NOT DETECTED NOT DETECTED Final   Enterococcus Faecium NOT DETECTED NOT DETECTED Final   Listeria monocytogenes NOT DETECTED NOT DETECTED Final   Staphylococcus species NOT DETECTED NOT DETECTED Final   Staphylococcus aureus (BCID) NOT DETECTED NOT DETECTED Final   Staphylococcus epidermidis NOT DETECTED NOT DETECTED Final   Staphylococcus lugdunensis NOT DETECTED NOT DETECTED Final   Streptococcus species NOT DETECTED NOT DETECTED Final   Streptococcus agalactiae NOT DETECTED NOT DETECTED Final   Streptococcus pneumoniae NOT DETECTED NOT DETECTED Final   Streptococcus pyogenes NOT DETECTED NOT DETECTED Final   A.calcoaceticus-baumannii NOT DETECTED NOT DETECTED Final   Bacteroides fragilis NOT DETECTED NOT DETECTED Final   Enterobacterales DETECTED (A) NOT DETECTED Final    Comment: Enterobacterales represent a large order of gram negative bacteria, not a single organism. CRITICAL RESULT CALLED TO, READ BACK BY AND VERIFIED WITH:  PHARMD C/ STEVEN H. 07/14/21 1405 A. LAFRANCE    Enterobacter cloacae complex NOT DETECTED NOT DETECTED Final   Escherichia coli NOT DETECTED NOT DETECTED Final  Klebsiella aerogenes NOT DETECTED NOT DETECTED Final   Klebsiella oxytoca NOT DETECTED NOT DETECTED Final   Klebsiella pneumoniae NOT DETECTED NOT DETECTED Final   Proteus species NOT DETECTED NOT DETECTED Final   Salmonella species DETECTED (A) NOT DETECTED Final    Comment: CRITICAL RESULT CALLED TO, READ BACK BY AND VERIFIED WITH:  PHARMD C/ STEVEN H. 07/14/21 1405 A. LAFRANCE    Serratia marcescens NOT DETECTED NOT DETECTED Final   Haemophilus influenzae NOT DETECTED NOT DETECTED Final   Neisseria meningitidis NOT DETECTED NOT DETECTED Final   Pseudomonas aeruginosa NOT DETECTED NOT DETECTED Final   Stenotrophomonas maltophilia NOT DETECTED NOT DETECTED Final   Candida albicans NOT DETECTED NOT DETECTED Final   Candida auris NOT DETECTED NOT DETECTED Final   Candida  glabrata NOT DETECTED NOT DETECTED Final   Candida krusei NOT DETECTED NOT DETECTED Final   Candida parapsilosis NOT DETECTED NOT DETECTED Final   Candida tropicalis NOT DETECTED NOT DETECTED Final   Cryptococcus neoformans/gattii NOT DETECTED NOT DETECTED Final   CTX-M ESBL NOT DETECTED NOT DETECTED Final   Carbapenem resistance IMP NOT DETECTED NOT DETECTED Final   Carbapenem resistance KPC NOT DETECTED NOT DETECTED Final   Carbapenem resistance NDM NOT DETECTED NOT DETECTED Final   Carbapenem resist OXA 48 LIKE NOT DETECTED NOT DETECTED Final   Carbapenem resistance VIM NOT DETECTED NOT DETECTED Final    Comment: Performed at [facility], [place] [address] [place], [place] [address]  Blood Culture (routine x 2)     Status: Abnormal   Collection Time: 07/13/21 12:30 PM   Specimen: BLOOD LEFT ARM  Result Value Ref Range Status   Specimen Description   Final    BLOOD LEFT ARM Performed at [facility], [address], [place], [place] [address]    Special Requests   Final    BOTTLES DRAWN AEROBIC AND ANAEROBIC Blood Culture adequate volume Performed at [facility], [address], [place], [place] [address]    Culture  Setup Time   Final    GRAM NEGATIVE RODS ANAEROBIC AND AEROBIC BOTTLES Gram Stain Report Called to,Read Back By and Verified With: [email or web address]  BY MATTHEWS, B 6.14.2023 Performed at [facility], [address], [place], [place] [address]    Culture (A)  Final    SALMONELLA SPECIES SUSCEPTIBILITIES PERFORMED ON PREVIOUS CULTURE WITHIN THE LAST 5 DAYS. Performed at [facility], [place] [address], [place], [place] [address]    Report Status 07/16/2021 FINAL  Final  Gastrointestinal Panel by PCR , Stool     Status: Abnormal   Collection Time: 07/13/21  5:28 PM   Specimen: Stool  Result Value Ref Range Status   Campylobacter species NOT DETECTED NOT DETECTED Final   Plesimonas shigelloides NOT DETECTED NOT DETECTED Final   Salmonella species DETECTED (A) NOT  DETECTED Final    Comment: RESULT CALLED TO, READ BACK BY AND VERIFIED WITH: SHANNON BROWN 07/15/21 1448 KLW    Yersinia enterocolitica NOT DETECTED NOT DETECTED Final   Vibrio species NOT DETECTED NOT DETECTED Final   Vibrio cholerae NOT DETECTED NOT DETECTED Final   Enteroaggregative E coli (EAEC) NOT DETECTED NOT DETECTED Final   Enteropathogenic E coli (EPEC) NOT DETECTED NOT DETECTED Final   Enterotoxigenic E coli (ETEC) NOT DETECTED NOT DETECTED Final   Shiga like toxin producing E coli (STEC) NOT DETECTED NOT DETECTED Final   Shigella/Enteroinvasive E coli (EIEC) NOT DETECTED NOT DETECTED Final   Cryptosporidium NOT DETECTED NOT DETECTED  Final   Cyclospora cayetanensis NOT DETECTED NOT DETECTED Final   Entamoeba histolytica NOT DETECTED NOT DETECTED Final   Giardia lamblia NOT DETECTED NOT DETECTED Final   Adenovirus F40/41 NOT DETECTED NOT DETECTED Final   Astrovirus NOT DETECTED NOT DETECTED Final   Norovirus GI/GII NOT DETECTED NOT DETECTED Final   Rotavirus A NOT DETECTED NOT DETECTED Final   Sapovirus (I, II, IV, and V) NOT DETECTED NOT DETECTED Final    Comment: Performed at [facility], [place], [place] [address]  C Difficile Quick Screen w PCR reflex     Status: None   Collection Time: 07/14/21  5:18 PM   Specimen: Stool  Result Value Ref Range Status   C Diff antigen NEGATIVE NEGATIVE Final   C Diff toxin NEGATIVE NEGATIVE Final   C Diff interpretation No C. difficile detected.  Final    Comment: Performed at [facility], [address], [place], [place] [address]     Time coordinating discharge: 35 minutes  SIGNED:   Rodena Goldmann, DO [facility] 07/16/2021, 1:11 PM  If 7PM-7AM, please contact night-coverage [email or web address]

## 2021-07-19 ENCOUNTER — Other Ambulatory Visit: Payer: Self-pay | Admitting: *Deleted

## 2021-07-19 ENCOUNTER — Encounter: Payer: Self-pay | Admitting: Internal Medicine

## 2021-07-19 DIAGNOSIS — A029 Salmonella infection, unspecified: Secondary | ICD-10-CM

## 2021-07-19 DIAGNOSIS — K529 Noninfective gastroenteritis and colitis, unspecified: Secondary | ICD-10-CM

## 2021-07-19 NOTE — Telephone Encounter (Signed)
LMOM for pt to have labs completed in 2 weeks. Labs entered into Epic. Mailed lab requisitions.

## 2021-07-24 LAB — CULTURE, BLOOD (ROUTINE X 2)

## 2021-08-04 NOTE — Telephone Encounter (Signed)
LMOM for pt to call office  

## 2021-08-05 NOTE — Telephone Encounter (Signed)
LMOM for pt to call office  

## 2021-08-07 LAB — HEPATIC FUNCTION PANEL
AG Ratio: 2.3 (calc) (ref 1.0–2.5)
ALT: 19 U/L (ref 9–46)
AST: 18 U/L (ref 10–40)
Albumin: 5.1 g/dL (ref 3.6–5.1)
Alkaline phosphatase (APISO): 50 U/L (ref 36–130)
Bilirubin, Direct: 0.2 mg/dL (ref 0.0–0.2)
Globulin: 2.2 g/dL (calc) (ref 1.9–3.7)
Indirect Bilirubin: 0.9 mg/dL (calc) (ref 0.2–1.2)
Total Bilirubin: 1.1 mg/dL (ref 0.2–1.2)
Total Protein: 7.3 g/dL (ref 6.1–8.1)
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
## 2021-08-09 ENCOUNTER — Ambulatory Visit (INDEPENDENT_AMBULATORY_CARE_PROVIDER_SITE_OTHER): Payer: BLUE CROSS/BLUE SHIELD | Admitting: Gastroenterology

## 2021-08-09 ENCOUNTER — Encounter: Payer: Self-pay | Admitting: *Deleted

## 2021-08-09 ENCOUNTER — Encounter: Payer: Self-pay | Admitting: Gastroenterology

## 2021-08-09 VITALS — BP 138/83 | HR 76 | Temp 97.3°F | Ht 70.0 in | Wt 197.8 lb

## 2021-08-09 DIAGNOSIS — K625 Hemorrhage of anus and rectum: Secondary | ICD-10-CM | POA: Diagnosis not present

## 2021-08-09 DIAGNOSIS — R935 Abnormal findings on diagnostic imaging of other abdominal regions, including retroperitoneum: Secondary | ICD-10-CM

## 2021-08-09 DIAGNOSIS — K529 Noninfective gastroenteritis and colitis, unspecified: Secondary | ICD-10-CM

## 2021-08-09 DIAGNOSIS — R7989 Other specified abnormal findings of blood chemistry: Secondary | ICD-10-CM

## 2021-08-09 DIAGNOSIS — A029 Salmonella infection, unspecified: Secondary | ICD-10-CM | POA: Diagnosis not present

## 2021-08-09 DIAGNOSIS — R1084 Generalized abdominal pain: Secondary | ICD-10-CM | POA: Diagnosis not present

## 2021-08-09 MED ORDER — PEG 3350-KCL-NA BICARB-NACL 420 G PO SOLR: ORAL | 0 refills | Status: AC

## 2021-08-09 NOTE — Patient Instructions (Addendum)
We are scheduling you for colonoscopy with propofol with Dr. Marletta Lor in the near future to rule out inflammatory bowel disease.  I would like to repeat stool studies to be sure primary infection as resolved, there can sometimes be shedding of the bacteria for a little while but would like to make sure both bacterial infections have resolved. Please pick up the supplies to give a stool sample from [facility].   You may use imodium 2 mg tablets once or twice a day as needed if you begin having more than 4 loose stools a day but I would not take any within 5 days prior to your colonoscopy.   To help with diarrhea you can follow a BRAT diet to help with your symptoms or follow these diet recommendations: Keep a food diary. This may help you identify and avoid any foods that trigger your symptoms. Drink enough fluid to keep your urine pale yellow. Follow a well-balanced diet as told by your health care provider. This may include: Avoiding carbonated drinks. Avoiding popcorn, vegetable skins, nuts, and other high-fiber foods. Avoiding high-fat foods. Eating smaller meals, but more often. Limiting sugary drinks. Limiting caffeine. Follow food safety recommendations as told by your health care provider. This may include making sure you: Avoid eating raw or undercooked meat, fish, or eggs. Do not eat or drink spoiled or expired foods and drinks.

## 2021-08-09 NOTE — Telephone Encounter (Signed)
Noted  

## 2021-08-09 NOTE — Addendum Note (Signed)
Addended by: Armstead Peaks on: 08/09/2021 09:52 AM   Modules accepted: Orders

## 2021-08-27 ENCOUNTER — Encounter (HOSPITAL_COMMUNITY): Payer: Self-pay | Admitting: *Deleted

## 2021-08-27 ENCOUNTER — Emergency Department (HOSPITAL_COMMUNITY)
Admission: EM | Admit: 2021-08-27 | Discharge: 2021-08-28 | Disposition: A | Discharge status: Home / Self Care | Payer: BLUE CROSS/BLUE SHIELD | Attending: Emergency Medicine | Admitting: Emergency Medicine

## 2021-08-27 ENCOUNTER — Other Ambulatory Visit: Payer: Self-pay

## 2021-08-27 DIAGNOSIS — X088XXA Exposure to other specified smoke, fire and flames, initial encounter: Secondary | ICD-10-CM | POA: Insufficient documentation

## 2021-08-27 DIAGNOSIS — T20111A Burn of first degree of right ear [any part, except ear drum], initial encounter: Secondary | ICD-10-CM | POA: Diagnosis not present

## 2021-08-27 DIAGNOSIS — T23101A Burn of first degree of right hand, unspecified site, initial encounter: Secondary | ICD-10-CM | POA: Diagnosis present

## 2021-08-27 DIAGNOSIS — T310 Burns involving less than 10% of body surface: Secondary | ICD-10-CM | POA: Insufficient documentation

## 2021-08-27 DIAGNOSIS — T2010XA Burn of first degree of head, face, and neck, unspecified site, initial encounter: Secondary | ICD-10-CM

## 2021-08-27 MED ORDER — IBUPROFEN 800 MG PO TABS
800.0000 mg | ORAL_TABLET | Freq: Once | ORAL | Status: AC
  Administered 2021-08-27: 800 mg via ORAL
  Filled 2021-08-27: qty 1

## 2021-08-27 MED ORDER — BACITRACIN ZINC 500 UNIT/GM EX OINT
TOPICAL_OINTMENT | Freq: Once | CUTANEOUS | Status: AC
  Administered 2021-08-27: 1 via TOPICAL
  Filled 2021-08-27: qty 0.9

## 2021-08-27 MED ORDER — ERYTHROMYCIN 5 MG/GM OP OINT
TOPICAL_OINTMENT | Freq: Once | OPHTHALMIC | Status: AC
  Administered 2021-08-27: 1 via OPHTHALMIC
  Filled 2021-08-27: qty 3.5
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
## 2021-09-16 ENCOUNTER — Encounter (HOSPITAL_COMMUNITY): Payer: Self-pay | Admitting: Anesthesiology

## 2021-09-17 ENCOUNTER — Encounter (HOSPITAL_COMMUNITY): Admission: RE | Payer: Self-pay | Source: Ambulatory Visit

## 2021-09-17 ENCOUNTER — Ambulatory Visit (HOSPITAL_COMMUNITY): Admission: RE | Admit: 2021-09-17 | Payer: BLUE CROSS/BLUE SHIELD | Source: Ambulatory Visit

## 2021-09-17 DIAGNOSIS — K529 Noninfective gastroenteritis and colitis, unspecified: Secondary | ICD-10-CM

## 2021-09-17 SURGERY — COLONOSCOPY WITH PROPOFOL
Anesthesia: Monitor Anesthesia Care

## 2021-09-17 NOTE — Progress Notes (Signed)
Patient scheduled for a colonoscopy. Unable to reach patient on phone. Called patient's wife, Jon Gills. Patient's wife stated they didn't know he had a procedure today. Informed wife, procedure would need to be rescheduled due to patient not taking a prep. Patient's wife verbalized understanding.

## 2022-11-30 IMAGING — DX DG CHEST 1V PORT
1 series · 1 of 1 positions shown · non-contrast
Comparison: Chest radiograph May 15, 2012.

CLINICAL DATA: Questionable sepsis - evaluate for abnormality

EXAM:
PORTABLE CHEST 1 VIEW

[chest ap]
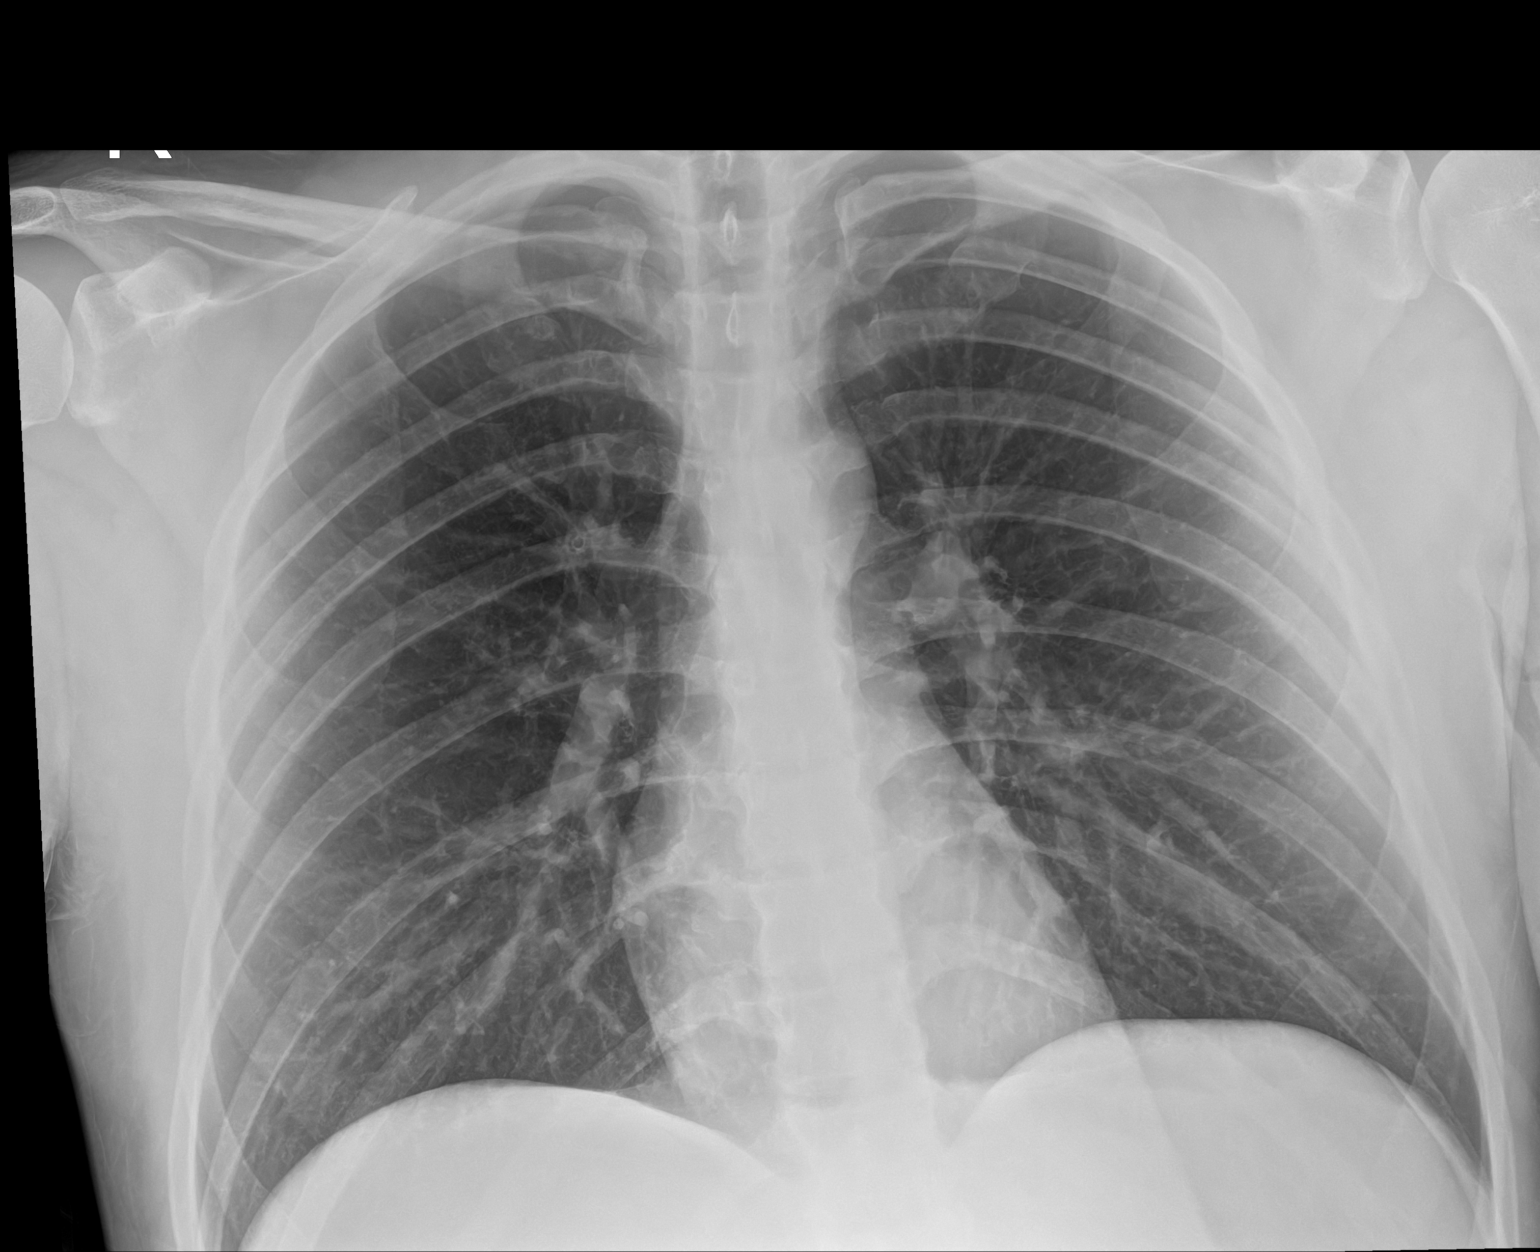

[1 of 1 positions shown; findings below may reference images not displayed]

FINDINGS: No consolidation. No visible pleural effusions or pneumothorax.
Cardiomediastinal silhouette is within normal limits. No displaced
fracture.
IMPRESSION: No evidence of acute cardiopulmonary disease.

## 2022-11-30 IMAGING — CT CT ABD-PELV W/ CM
2 of 4 series · 15 of 46 positions shown, 17 images · IV contrast (Omnipaque or Isovue)
Comparison: CT of the spine from Sunday September, 2019.

CLINICAL DATA: A 25-year-old male presents with history of nausea
vomiting and abdominal pain with diarrhea and fevers.

EXAM:
CT ABDOMEN AND PELVIS WITH CONTRAST
TECHNIQUE: Multidetector CT imaging of the abdomen and pelvis was performed
using the standard protocol following bolus administration of
intravenous contrast.

[Series 2: axial st · axial · 0.75mm/px · z∈[+898,+1322]mm · 12 of 99 slices shown, 14 images]
[im 7/99  soft-tissue]
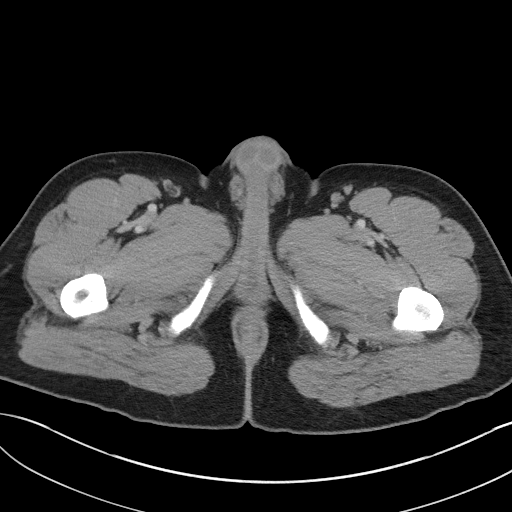
[im 7/99  bone]
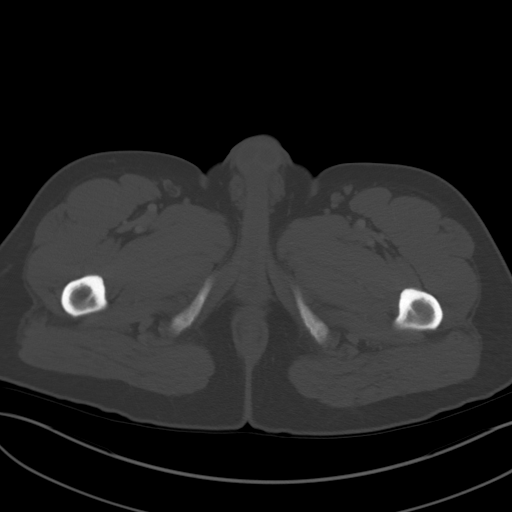
[im 14/99  soft-tissue]
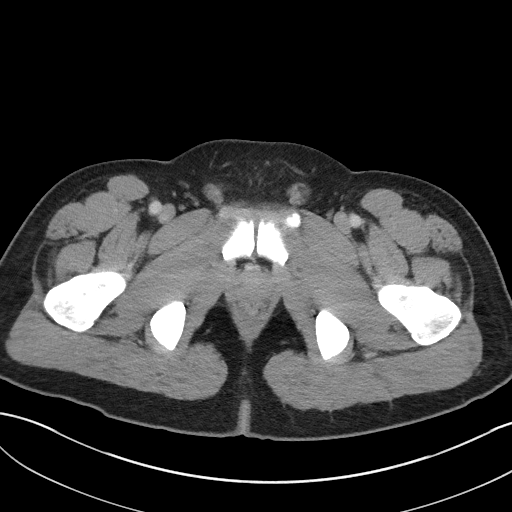
[im 20/99  soft-tissue]
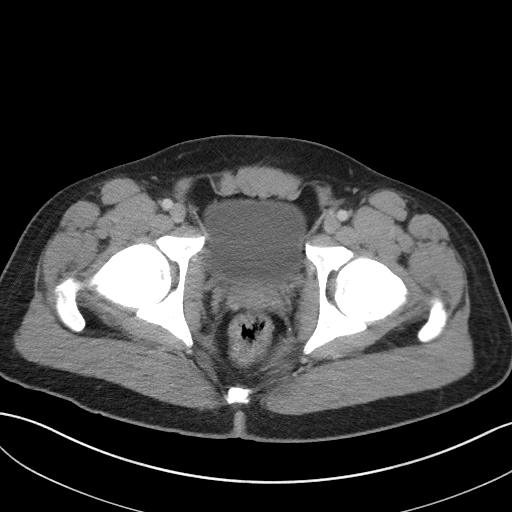
[im 33/99  soft-tissue]
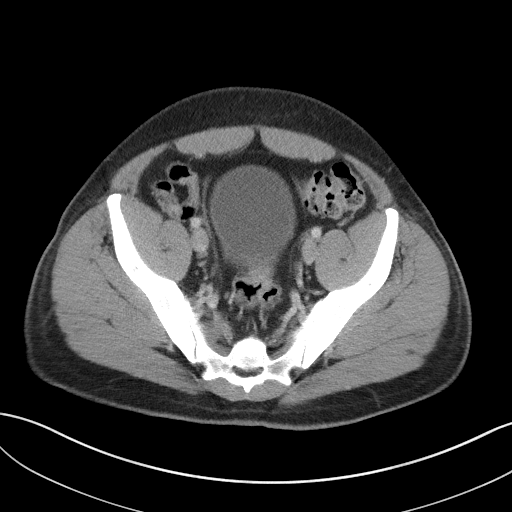
[im 40/99  soft-tissue]
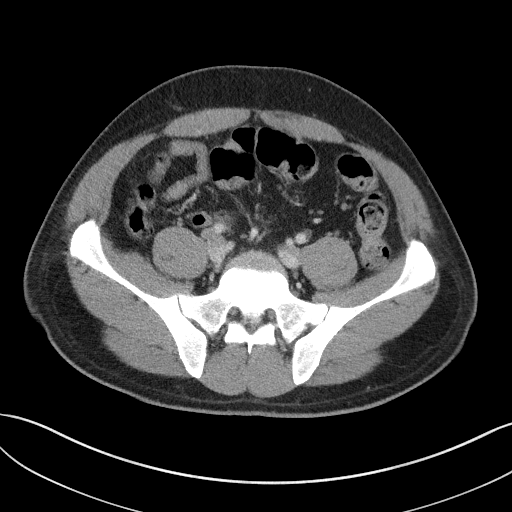
[im 46/99  soft-tissue]
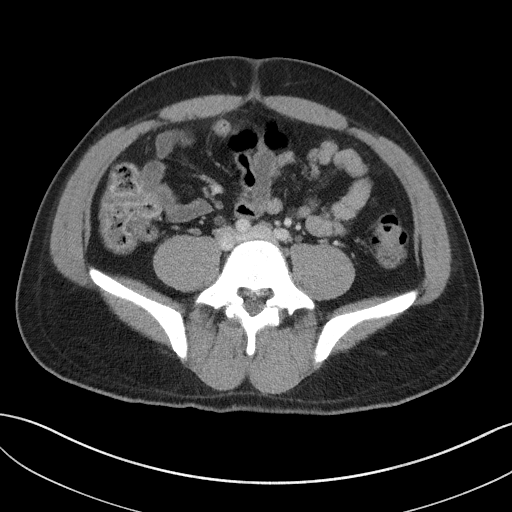
[im 53/99  soft-tissue]
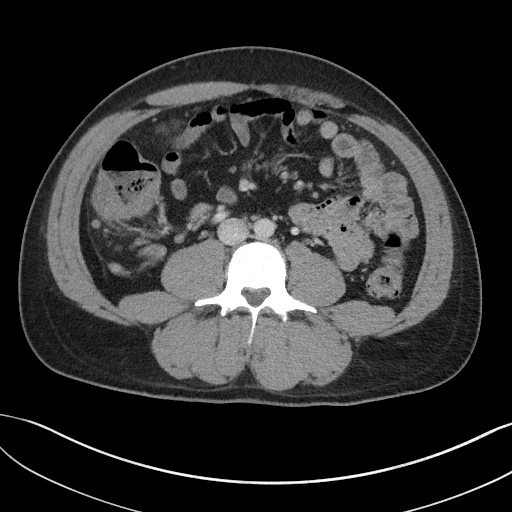
[im 59/99  soft-tissue]
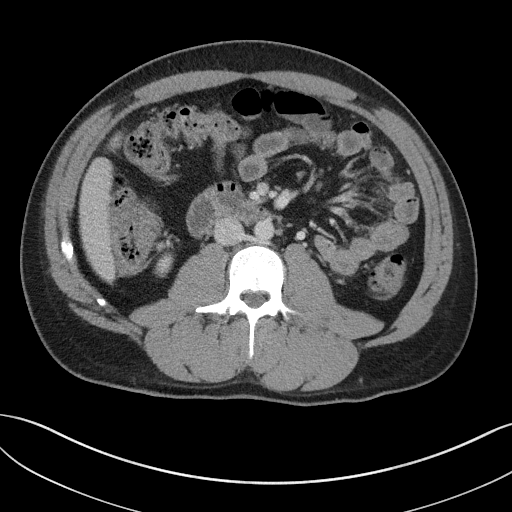
[im 66/99  soft-tissue]
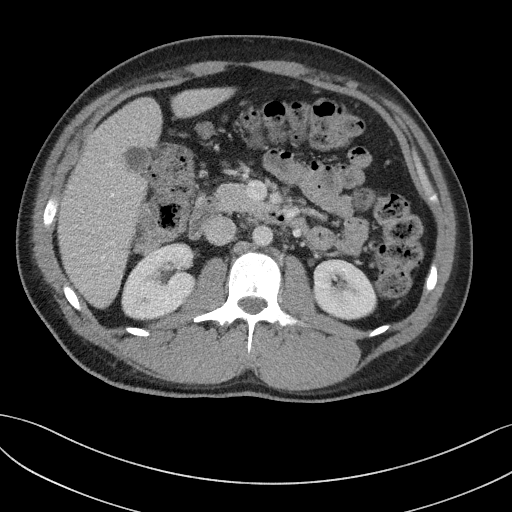
[im 66/99  bone]
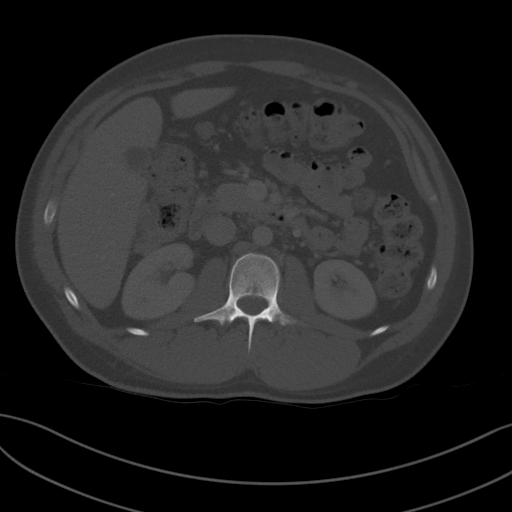
[im 79/99  soft-tissue]
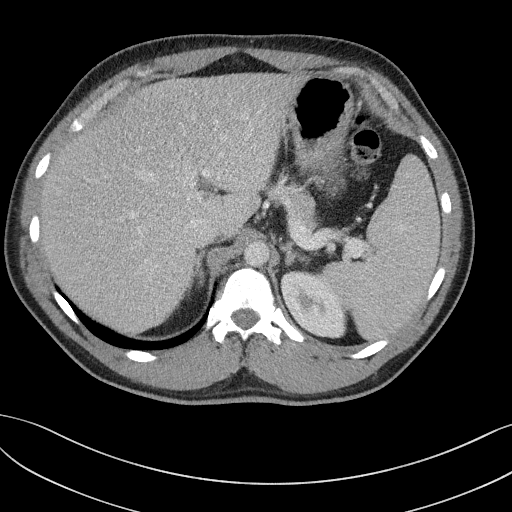
[im 85/99  soft-tissue]
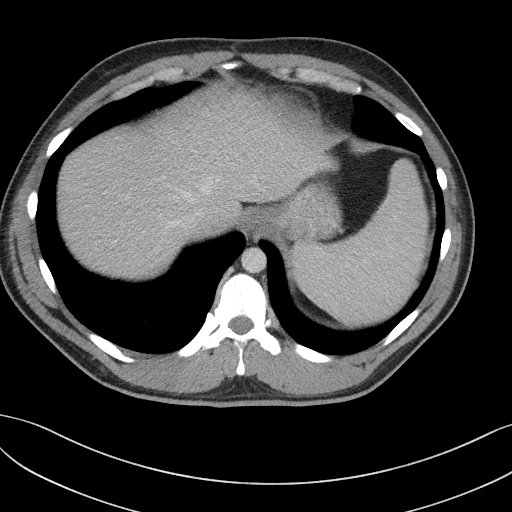
[im 92/99  soft-tissue]
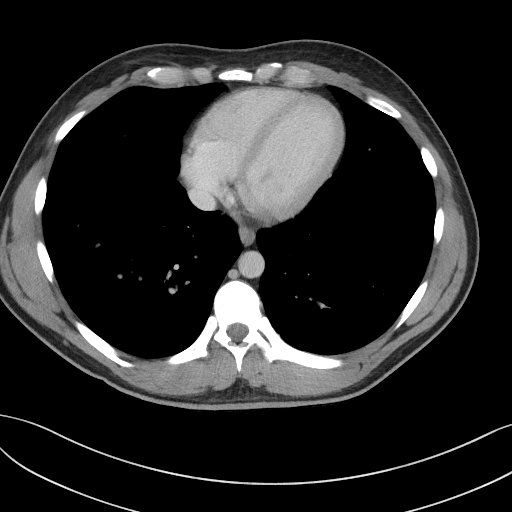

[Series 5: coronal st · coronal · 0.86mm/px · 3 of 117 slices shown]
[im 39/117  soft-tissue]
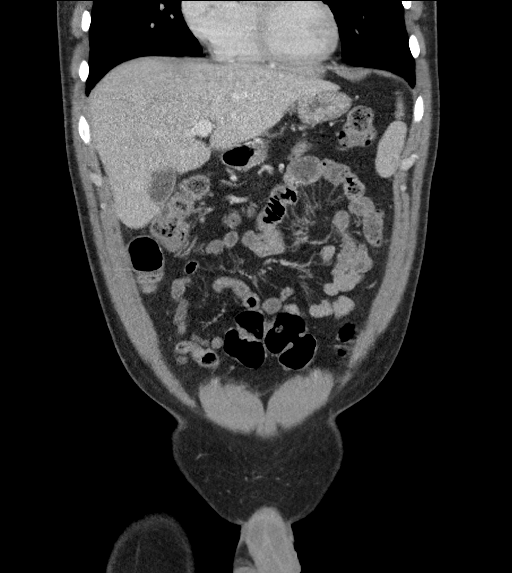
[im 52/117  soft-tissue]
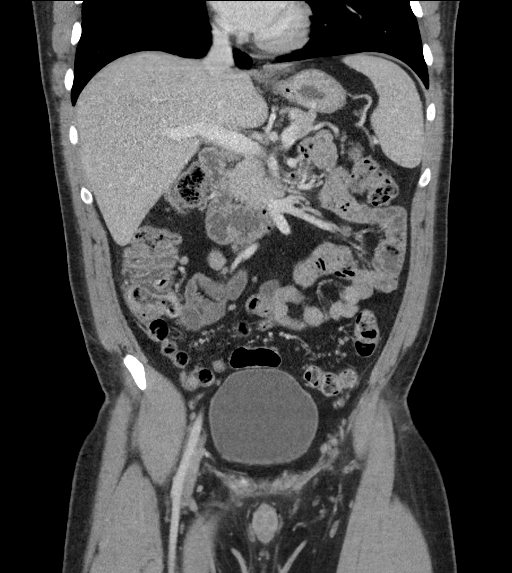
[im 65/117  soft-tissue]
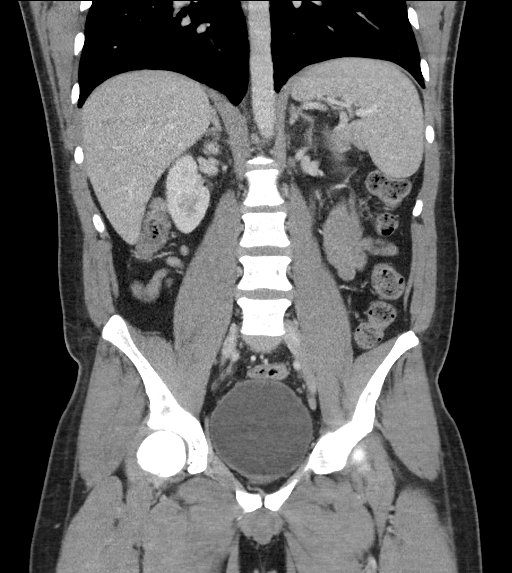

[15 of 46 positions shown; findings below may reference images not displayed]

RADIATION DOSE REDUCTION: This exam was performed according to the
departmental dose-optimization program which includes automated
exposure control, adjustment of the mA and/or kV according to
patient size and/or use of iterative reconstruction technique.

CONTRAST:  100mL OMNIPAQUE IOHEXOL 300 MG/ML  SOLN
FINDINGS: Lower chest: Lung bases are clear. No effusion or consolidation.
Visualized heart is normal size. No pericardial effusion.

Hepatobiliary: No focal, suspicious hepatic lesion. No
pericholecystic stranding. No biliary duct dilation. Portal vein is
patent.

Pancreas: Normal, without mass, inflammation or ductal dilatation.

Spleen: Normal.

Adrenals/Urinary Tract: Adrenal glands are normal.

Symmetric renal enhancement without hydronephrosis. No perivesical
stranding. No bladder wall thickening.

Stomach/Bowel: Stomach is normal by CT. Mild mural stratification of
the terminal ileum is suggested best seen on coronal images

Appendix top normal caliber without signs of periappendiceal
stranding but with mild local nodal enlargement. Mild mural
stratification of the ascending colon and to a lesser extent
proximal transverse colon is also evident on today's exam. The
remainder of the colon is unremarkable. No signs of pneumatosis. No
pneumoperitoneum.

Vascular/Lymphatic:

Aorta with smooth contours. IVC with smooth contours. No aneurysmal
dilation of the abdominal aorta. There is no gastrohepatic or
hepatoduodenal ligament lymphadenopathy. No retroperitoneal
adenopathy.

RIGHT sided abdominal nodal enlargement, largest 14 mm in the
ileocolic mesentery.

No pelvic sidewall lymphadenopathy.

. Prominence of lymph nodes in the RIGHT hemiabdomen the ileocolic
and pericolonic regions are less than a cm short axis.

Reproductive: Unremarkable by CT.

Other: No ascites.

Musculoskeletal: No acute or significant osseous findings.
IMPRESSION: 1. Signs of mural stratification of the colon, mainly the ascending
colon. Query mild distal small bowel thickening as well. Findings
may represent enterocolitis. Correlation with infectious causes is
suggested. Given potential mild mural stratification of the
ileocecal valve would also consider the possibility of inflammatory
bowel disease.
2. Appendiceal thickening without periappendiceal stranding is
favored to be secondary and would be equivocal for acute appendiceal
process in the appropriate clinical setting.
3. Reactive lymph nodes in the RIGHT hemiabdomen. Given largest is
14 mm would consider 3 to six-month follow-up to ensure resolution
and or improvement

## 2023-07-30 ENCOUNTER — Emergency Department (HOSPITAL_COMMUNITY)
Admission: EM | Admit: 2023-07-30 | Discharge: 2023-07-30 | Disposition: A | Discharge status: Home / Self Care | Attending: Emergency Medicine | Admitting: Emergency Medicine

## 2023-07-30 ENCOUNTER — Encounter (HOSPITAL_COMMUNITY): Payer: Self-pay

## 2023-07-30 ENCOUNTER — Emergency Department (HOSPITAL_COMMUNITY)

## 2023-07-30 ENCOUNTER — Other Ambulatory Visit: Payer: Self-pay

## 2023-07-30 DIAGNOSIS — R1084 Generalized abdominal pain: Secondary | ICD-10-CM | POA: Diagnosis present

## 2023-07-30 DIAGNOSIS — R1110 Vomiting, unspecified: Secondary | ICD-10-CM | POA: Diagnosis not present

## 2023-07-30 DIAGNOSIS — K529 Noninfective gastroenteritis and colitis, unspecified: Secondary | ICD-10-CM | POA: Diagnosis not present

## 2023-07-30 LAB — COMPREHENSIVE METABOLIC PANEL WITH GFR
ALT: 18 U/L (ref 0–44)
AST: 25 U/L (ref 15–41)
Albumin: 5.1 g/dL — ABNORMAL HIGH (ref 3.5–5.0)
Alkaline Phosphatase: 50 U/L (ref 38–126)
Anion gap: 14 (ref 5–15)
BUN: 11 mg/dL (ref 6–20)
CO2: 24 mmol/L (ref 22–32)
Calcium: 10.4 mg/dL — ABNORMAL HIGH (ref 8.9–10.3)
Chloride: 102 mmol/L (ref 98–111)
Creatinine, Ser: 1.12 mg/dL (ref 0.61–1.24)
GFR, Estimated: >60 mL/min (ref >60)
Glucose, Bld: 115 mg/dL — ABNORMAL HIGH (ref 70–99)
Potassium: 3.9 mmol/L (ref 3.5–5.1)
Sodium: 140 mmol/L (ref 135–145)
Total Bilirubin: 0.9 mg/dL (ref 0.0–1.2)
Total Protein: 8.3 g/dL — ABNORMAL HIGH (ref 6.5–8.1)

## 2023-07-30 LAB — CBC
HCT: 47.8 % (ref 39.0–52.0)
Hemoglobin: 16.7 g/dL (ref 13.0–17.0)
MCH: 30.7 pg (ref 26.0–34.0)
MCHC: 34.9 g/dL (ref 30.0–36.0)
MCV: 87.9 fL (ref 80.0–100.0)
Platelets: 370 10*3/uL (ref 150–400)
RBC: 5.44 MIL/uL (ref 4.22–5.81)
RDW: 12.9 % (ref 11.5–15.5)
WBC: 14.6 10*3/uL — ABNORMAL HIGH (ref 4.0–10.5)
nRBC: 0 % (ref 0.0–0.2)

## 2023-07-30 LAB — LIPASE, BLOOD: Lipase: 29 U/L (ref 11–51)

## 2023-07-30 MED ORDER — CIPROFLOXACIN HCL 500 MG PO TABS: 500.0000 mg | ORAL_TABLET | Freq: Two times a day (BID) | ORAL | 0 refills | Status: DC

## 2023-07-30 MED ORDER — IBUPROFEN 800 MG PO TABS: 800.0000 mg | ORAL_TABLET | Freq: Three times a day (TID) | ORAL | 0 refills | Status: DC | PRN

## 2023-07-30 MED ORDER — IBUPROFEN 800 MG PO TABS: 800.0000 mg | ORAL_TABLET | Freq: Three times a day (TID) | ORAL | 0 refills | Status: AC | PRN

## 2023-07-30 MED ORDER — ONDANSETRON 4 MG PO TBDP
ORAL_TABLET | ORAL | 0 refills | Status: DC
Start: 2023-07-30 — End: 2023-07-30

## 2023-07-30 MED ORDER — KETOROLAC TROMETHAMINE 30 MG/ML IJ SOLN
30.0000 mg | Freq: Once | INTRAMUSCULAR | Status: AC
  Administered 2023-07-30: 30 mg via INTRAVENOUS
  Filled 2023-07-30: qty 1

## 2023-07-30 MED ORDER — ONDANSETRON 4 MG PO TBDP: ORAL_TABLET | ORAL | 0 refills | Status: AC

## 2023-07-30 MED ORDER — SODIUM CHLORIDE 0.9 % IV BOLUS
1000.0000 mL | Freq: Once | INTRAVENOUS | Status: AC
  Administered 2023-07-30: 1000 mL via INTRAVENOUS

## 2023-07-30 MED ORDER — ONDANSETRON HCL 4 MG/2ML IJ SOLN
4.0000 mg | Freq: Once | INTRAMUSCULAR | Status: AC
Start: 2023-07-30 — End: 2023-07-30
  Administered 2023-07-30: 4 mg via INTRAVENOUS
  Filled 2023-07-30: qty 2

## 2023-07-30 MED ORDER — CIPROFLOXACIN HCL 500 MG PO TABS: 500.0000 mg | ORAL_TABLET | Freq: Two times a day (BID) | ORAL | 0 refills | Status: AC

## 2023-07-30 NOTE — ED Notes (Signed)
 Pt aware of pharmacy changed. 07/30/2023 @1500hrs 

## 2023-07-30 NOTE — ED Notes (Signed)
 Pt states that pain has gotten better after given Toradol .

## 2023-07-30 NOTE — Discharge Instructions (Signed)
 Drink plenty of fluids.  Follow-up with your family doctor in 2 to 3 days for recheck.  Return if getting worse.

## 2023-07-30 NOTE — ED Triage Notes (Addendum)
 Pt from home complains of right lower abdominal pain (9/10) that started 5pm yesterday, pain getting worse, pt endorses N/V, diarrhea and fatigue. Denies SOB  and CP. PT AAOx4, ambulatory. Pt states that he's been having sulfur burps.

## 2023-07-30 NOTE — ED Notes (Signed)
 Pt requested to change pharmacy due to they are closed today. 1451hrs. 07/30/2023.  CN making EDP aware.

## 2023-07-31 LAB — GASTROINTESTINAL PANEL BY PCR, STOOL (REPLACES STOOL CULTURE)

## 2023-07-31 NOTE — ED Provider Notes (Signed)
 [place] EMERGENC[facility] DEPARTMENT AT [facility] Provider Note   CSN: [id number] Arrival date & time: 07/30/23  1005     Patient presents with: Abdominal Pain, Nausea, and Emesis   Tom Collins is a 28 y.o. male.   Patient complains of nausea vomiting and diarrhea.  No fevers no chills  The history is provided by the patient and medical records.  Abdominal Pain Pain location:  Generalized Pain quality: aching   Pain radiates to:  Does not radiate Pain severity:  Mild Onset quality:  Sudden Timing:  Intermittent Associated symptoms: diarrhea and vomiting   Associated symptoms: no chest pain, no cough, no fatigue and no hematuria   Emesis Associated symptoms: abdominal pain and diarrhea   Associated symptoms: no cough and no headaches        Prior to Admission medications   Medication Sig Start Date End Date Taking? Authorizing Provider  acetaminophen  (TYLENOL ) 500 MG tablet Take 1,000 mg by mouth every 6 (six) hours as needed.    [provider]  amphetamine -dextroamphetamine  (ADDERALL) 20 MG tablet Take 20 mg by mouth 2 (two) times daily. 06/24/21   [provider]  ciprofloxacin  (CIPRO ) 500 MG tablet Take 1 tablet (500 mg total) by mouth 2 (two) times daily. One po bid x 7 days 07/30/23   Suzette Pac, MD  ibuprofen  (ADVIL ) 800 MG tablet Take 1 tablet (800 mg total) by mouth every 8 (eight) hours as needed for moderate pain (pain score 4-6). 07/30/23   Lee-Ann Gal, MD  Levocetirizine Dihydrochloride (XYZAL ALLERGY 24HR PO) Take 1 tablet by mouth daily.    [provider]  ondansetron  (ZOFRAN -ODT) 4 MG disintegrating tablet 4mg  ODT q4 hours prn nausea/vomit 07/30/23   Kyerra Vargo, MD  polyethylene glycol-electrolytes (NULYTELY) 420 g solution As directed 08/09/21   Carver, Charles K, DO  sertraline (ZOLOFT) 50 MG tablet Take 50 mg by mouth daily.    [provider]  temazepam  (RESTORIL ) 22.5 MG capsule Take 22.5 mg by mouth at  bedtime. 06/24/21   [provider]    Allergies: Bee venom and Other    Review of Systems  Constitutional:  Negative for appetite change and fatigue.  HENT:  Negative for congestion, ear discharge and sinus pressure.   Eyes:  Negative for discharge.  Respiratory:  Negative for cough.   Cardiovascular:  Negative for chest pain.  Gastrointestinal:  Positive for abdominal pain, diarrhea and vomiting.  Genitourinary:  Negative for frequency and hematuria.  Musculoskeletal:  Negative for back pain.  Skin:  Negative for rash.  Neurological:  Negative for seizures and headaches.  Psychiatric/Behavioral:  Negative for hallucinations.     Updated Vital Signs BP (!) 139/103   Pulse 75   Temp 98.2 F (36.8 C) (Oral)   Resp 18   Ht 5' 9 (1.753 m)   Wt 88.5 kg   SpO2 100%   BMI 28.80 kg/m   Physical Exam Vitals and nursing note reviewed.  Constitutional:      Appearance: He is well-developed.  HENT:     Head: Normocephalic.     Nose: Nose normal.   Eyes:     General: No scleral icterus.    Conjunctiva/sclera: Conjunctivae normal.   Neck:     Thyroid: No thyromegaly.   Cardiovascular:     Rate and Rhythm: Normal rate and regular rhythm.     Heart sounds: No murmur heard.    No friction rub. No gallop.  Pulmonary:  Breath sounds: No stridor. No wheezing or rales.  Chest:     Chest wall: No tenderness.  Abdominal:     General: There is no distension.     Tenderness: There is no abdominal tenderness. There is no rebound.   Musculoskeletal:        General: Normal range of motion.     Cervical back: Neck supple.  Lymphadenopathy:     Cervical: No cervical adenopathy.   Skin:    Findings: No erythema or rash.   Neurological:     Mental Status: He is alert and oriented to person, place, and time.     Motor: No abnormal muscle tone.     Coordination: Coordination normal.   Psychiatric:        Behavior: Behavior normal.     (all labs ordered are  listed, but only abnormal results are displayed) Labs Reviewed  COMPREHENSIVE METABOLIC PANEL WITH GFR - Abnormal; Notable for the following components:      Result Value   Glucose, Bld 115 (*)    Calcium 10.4 (*)    Total Protein 8.3 (*)    Albumin 5.1 (*)    All other components within normal limits  CBC - Abnormal; Notable for the following components:   WBC 14.6 (*)    All other components within normal limits  GASTROINTESTINAL PANEL BY PCR, STOOL (REPLACES STOOL CULTURE)  LIPASE, BLOOD    EKG: None  Radiology: DG ABD ACUTE 2+V W 1V CHEST Result Date: 07/30/2023 CLINICAL DATA:  Right lower quadrant pain beginning 5 p.m. yesterday getting worse with nausea, vomiting and diarrhea. EXAM: DG ABDOMEN ACUTE WITH 1 VIEW CHEST COMPARISON:  Chest x-ray 07/13/2021 FINDINGS: Lungs are adequately inflated and otherwise clear. Cardiomediastinal silhouette and remainder of the chest is unchanged. No free air under the diaphragms. Abdominopelvic images demonstrate moderate digested material over the stomach. Paucity of bowel gas within the colon with mild air and stool over the left colon. Several air-filled small bowel loops in the mid and right abdomen with largest just left of midline measuring 3.1 cm in diameter. Few scattered air-fluid levels over the right abdomen likely over colon. No free peritoneal air. Remaining bones and soft tissues are unremarkable. IMPRESSION: 1. Several air-filled small bowel loops in the mid and right abdomen with largest just left of midline measuring 3.1 cm in diameter. Few scattered air-fluid levels over the right abdomen mostly over colon. Findings may be secondary to ileus or early small bowel obstruction. Recommend follow-up abdominal films. 2. No acute cardiopulmonary disease. Electronically Signed   By: Toribio Agreste M.D.   On: 07/30/2023 11:10     Procedures   Medications Ordered in the ED  sodium chloride  0.9 % bolus 1,000 mL (0 mLs Intravenous Stopped  07/30/23 1331)  ondansetron  (ZOFRAN ) injection 4 mg (4 mg Intravenous Given 07/30/23 1034)  ketorolac  (TORADOL ) 30 MG/ML injection 30 mg (30 mg Intravenous Given 07/30/23 1124)                                    Medical Decision Making Amount and/or Complexity of Data Reviewed Labs: ordered. Radiology: ordered.  Risk Prescription drug management.   Patient with vomiting and diarrhea.  Suspect infectious agent.  Patient is put on Cipro  and Motrin  and Zofran .  Stool studies have been ordered.  Patient will follow-up with his PCP     Final diagnoses:  Gastroenteritis  ED Discharge Orders          Ordered    ciprofloxacin  (CIPRO ) 500 MG tablet  2 times daily,   Status:  Discontinued        07/30/23 1303    ondansetron  (ZOFRAN -ODT) 4 MG disintegrating tablet  Status:  Discontinued        07/30/23 1303    ibuprofen  (ADVIL ) 800 MG tablet  Every 8 hours PRN,   Status:  Discontinued        07/30/23 1303    ciprofloxacin  (CIPRO ) 500 MG tablet  2 times daily        07/30/23 1456    ibuprofen  (ADVIL ) 800 MG tablet  Every 8 hours PRN        07/30/23 1456    ondansetron  (ZOFRAN -ODT) 4 MG disintegrating tablet        07/30/23 1456               Suzette Pac, MD 07/31/23 1026
